# Patient Record
Sex: Male | Born: 1990 | Race: Black or African American | Hispanic: No | Marital: Married | Smoking: Never smoker
Health system: Southern US, Community
[De-identification: ages and names within clinical notes are randomized; demographics above are authoritative.]

---

## 2017-08-01 ENCOUNTER — Ambulatory Visit: Payer: Self-pay

## 2017-08-09 ENCOUNTER — Other Ambulatory Visit: Payer: Self-pay | Admitting: Internal Medicine

## 2017-08-09 ENCOUNTER — Ambulatory Visit
Admission: RE | Admit: 2017-08-09 | Discharge: 2017-08-09 | Disposition: A | Discharge status: Home / Self Care | Payer: Self-pay | Source: Ambulatory Visit | Attending: Internal Medicine | Admitting: Internal Medicine

## 2017-08-09 DIAGNOSIS — R7611 Nonspecific reaction to tuberculin skin test without active tuberculosis: Secondary | ICD-10-CM

## 2017-08-29 ENCOUNTER — Other Ambulatory Visit: Payer: Self-pay

## 2017-08-29 ENCOUNTER — Ambulatory Visit (INDEPENDENT_AMBULATORY_CARE_PROVIDER_SITE_OTHER): Payer: Medicaid Other | Admitting: Family Medicine

## 2017-08-29 VITALS — BP 100/60 | HR 78 | Temp 98.7°F | Ht 64.0 in | Wt 134.8 lb

## 2017-08-29 DIAGNOSIS — Z789 Other specified health status: Secondary | ICD-10-CM

## 2017-08-29 DIAGNOSIS — M25562 Pain in left knee: Secondary | ICD-10-CM | POA: Diagnosis not present

## 2017-08-29 DIAGNOSIS — M25561 Pain in right knee: Secondary | ICD-10-CM | POA: Diagnosis not present

## 2017-08-29 DIAGNOSIS — Z0289 Encounter for other administrative examinations: Secondary | ICD-10-CM

## 2017-08-29 DIAGNOSIS — R161 Splenomegaly, not elsewhere classified: Secondary | ICD-10-CM | POA: Diagnosis not present

## 2017-08-29 DIAGNOSIS — Z227 Latent tuberculosis: Secondary | ICD-10-CM

## 2017-08-29 DIAGNOSIS — R7611 Nonspecific reaction to tuberculin skin test without active tuberculosis: Secondary | ICD-10-CM | POA: Diagnosis not present

## 2017-08-29 MED ORDER — IBUPROFEN 200 MG PO TABS: 200.0000 mg | ORAL_TABLET | Freq: Four times a day (QID) | ORAL | 0 refills | Status: AC | PRN

## 2017-08-29 MED ORDER — ACETAMINOPHEN 325 MG PO TABS: 650.0000 mg | ORAL_TABLET | Freq: Four times a day (QID) | ORAL | 0 refills | Status: AC | PRN
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
## 2017-08-29 NOTE — Assessment & Plan Note (Signed)
Bilateral knee pain is likely due to overuse from standing for long periods of time at work.  There is no injury or trauma and knees on physical exam are normal.  Reassured patient and advised symptomatic treatment with ibuprofen or Tylenol at home.  If no improvement after 4 weeks may consider getting bilateral knee x-rays however this is unlikely to be an acute bony issue.

## 2017-08-29 NOTE — Patient Instructions (Addendum)
Try over the counter ibuprofen or tylenol for your knee pain. - ibuprofen is 1-2 tablets of 200mg  to take as needed every 6 hours - acetaminophen is 1-2 tablets of 325mg  to take as needed every 6 hours  We will set you up for a ultrasound of your spleen and have you come back for an appointment here in 4 weeks.

## 2017-08-29 NOTE — Assessment & Plan Note (Signed)
Splenomegaly found on physical exam today.  Patient is asymptomatic.  Will obtain ultrasound to determine size of spleen.  Follow-up in 4 weeks.

## 2017-08-29 NOTE — Assessment & Plan Note (Signed)
Patient has had screening at the health department and was found to have latent TB.  He voiced that he would be contacted for treatment of this by the health department.

## 2017-08-30 DIAGNOSIS — Z227 Latent tuberculosis: Secondary | ICD-10-CM | POA: Insufficient documentation

## 2017-08-30 NOTE — Assessment & Plan Note (Signed)
Followed by Health dept.

## 2017-09-06 ENCOUNTER — Ambulatory Visit (HOSPITAL_COMMUNITY): Payer: Medicaid Other

## 2017-09-18 ENCOUNTER — Ambulatory Visit (HOSPITAL_COMMUNITY): Payer: Medicaid Other

## 2017-09-18 ENCOUNTER — Ambulatory Visit (HOSPITAL_COMMUNITY)
Admission: RE | Admit: 2017-09-18 | Discharge: 2017-09-18 | Disposition: A | Discharge status: Home / Self Care | Payer: Medicaid Other | Source: Ambulatory Visit | Attending: Family Medicine | Admitting: Family Medicine

## 2017-09-18 ENCOUNTER — Other Ambulatory Visit: Payer: Self-pay | Admitting: Family Medicine

## 2017-09-18 DIAGNOSIS — R161 Splenomegaly, not elsewhere classified: Secondary | ICD-10-CM

## 2017-09-26 ENCOUNTER — Ambulatory Visit: Payer: Medicaid Other | Admitting: Family Medicine

## 2017-10-16 ENCOUNTER — Encounter (HOSPITAL_COMMUNITY): Payer: Self-pay

## 2017-10-16 ENCOUNTER — Ambulatory Visit (HOSPITAL_COMMUNITY)
Admission: EM | Admit: 2017-10-16 | Discharge: 2017-10-16 | Disposition: A | Discharge status: Home / Self Care | Payer: Medicaid Other | Attending: Family Medicine | Admitting: Family Medicine

## 2017-10-16 DIAGNOSIS — M940 Chondrocostal junction syndrome [Tietze]: Secondary | ICD-10-CM | POA: Diagnosis not present

## 2017-10-16 DIAGNOSIS — K219 Gastro-esophageal reflux disease without esophagitis: Secondary | ICD-10-CM

## 2017-10-16 MED ORDER — DICLOFENAC SODIUM 75 MG PO TBEC: 75.0000 mg | DELAYED_RELEASE_TABLET | Freq: Two times a day (BID) | ORAL | 0 refills | Status: AC

## 2017-10-16 MED ORDER — RANITIDINE HCL 150 MG PO CAPS: 150.0000 mg | ORAL_CAPSULE | Freq: Every evening | ORAL | 1 refills | Status: AC
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
## 2017-10-16 NOTE — ED Triage Notes (Signed)
Pt complains of pain in his left chest area, said the pain has been present a year. Hurts when he turns a certain way or when he lifts something up. States it's worse when it's cold. Did not try any otc medication for this. Said the pain was intermittent until this month and now it's constant.

## 2018-07-19 ENCOUNTER — Ambulatory Visit (HOSPITAL_COMMUNITY)
Admission: RE | Admit: 2018-07-19 | Discharge: 2018-07-19 | Disposition: A | Discharge status: Home / Self Care | Payer: Self-pay | Attending: Psychiatry | Admitting: Psychiatry

## 2018-07-19 DIAGNOSIS — F4320 Adjustment disorder, unspecified: Secondary | ICD-10-CM | POA: Insufficient documentation

## 2018-07-19 NOTE — BH Assessment (Signed)
Assessment Note  Bryan Chaney is a separated 27 y.o. male who presents voluntarily to [facility] for a walk-in assessment via EMS. Pt speaks Swahili as his primary language & interpreter machine was used for assessment. EMS relayed that pt's brother & mother have concerns pt had not been sleeping in past few days. Pt has no known history of MH dx or tx. Pt reports no psychiatric medication. He denies current and past suicidal ideation & no past attempts. Pt denies sx of depression but reports some difficulty with decision to leave wife in [place]. Pt denies  homicidal ideation/ history of violence. Pt denies AVH & other sx of psychosis. Pt states current stressors include working a lot of hours with machines at [employer].  [name], Bryan Chaney. He denies hx of abuse and trauma. Pt denies family history of mental health dx. Pt has fair insight and judgment. Pt's memory is intact. Legal history includes no charges. ? Pt has no hx of IP or OP tx. Pt reports occasional use of alcohol "about 1-2 drinks twice weekly" / No other drug use ? MSE: Pt is casually dressed, alert, oriented x4 with normal speech and normal motor behavior. Eye contact is good. Pt's mood is euthymic and pleasant. Affect is appropriate to circumstance and congruent with mood. Thought process is coherent, incoherent (unsure if due to language barrier) & relevant. There is no indication pt is currently responding to internal stimuli or experiencing delusional thought content. Pt was cooperative throughout assessment.    Disposition: [name], NP recommends pt return home & seek medical care for sore throat & ears if he so chooses. Pt advised he may return for TTS assessment or seek outpt behavioral health tx if sleep becomes a problem  Diagnosis: F43.20 Adjustment Disorder, unspecified  Past Medical History: No past medical history on file.  No past surgical history on file.  Family  History: No family history on file.  Social History:  reports that he has never smoked. He has never used smokeless tobacco. He reports that he drinks alcohol. He reports that he does not use drugs.  Additional Social History:  Alcohol / Drug Use Pain Medications: none reported Prescriptions: none reported Over the Counter: unknown History of alcohol / drug use?: Yes Substance #1 Name of Substance 1: alcohol 1 - Amount (size/oz): about 2 drinks 1 - Frequency: about twice weekly  CIWA:   COWS:    Allergies: No Known Allergies  Home Medications:  (Not in a hospital admission)  OB/GYN Status:  No LMP for male patient.  General Assessment Data Location of Assessment: [facility] TTS Assessment: In system Is this a Tele or Face-to-Face Assessment?: Face-to-Face Is this an Initial Assessment or a Re-assessment for this encounter?: Initial Assessment Patient Accompanied by:: N/A Language Other than English: Yes What is your preferred language: Other (Comment: Enter the language)(Swahili) Living Arrangements: Other (Comment) What gender do you identify as?: Male Marital status: Separated Living Arrangements: Other (Comment)(own home) Can pt return to current living arrangement?: Yes Admission Status: Voluntary Is patient capable of signing voluntary admission?: Yes Referral Source: Self/Family/Friend Insurance type: [employer]     Crisis Care Plan Living Arrangements: Other (Comment)(own home) Name of Psychiatrist: none Name of Therapist: none  Education Status Is patient currently in school?: No Is the patient employed, unemployed or receiving disability?: Employed([employer])  Risk to self with the past 6 months Suicidal Ideation: No Has patient been a risk to  self within the past 6 months prior to admission? : No Suicidal Intent: No Has patient had any suicidal intent within the past 6 months prior to admission? : No Is patient at risk for  suicide?: No Suicidal Plan?: No Has patient had any suicidal plan within the past 6 months prior to admission? : No Access to Means: No What has been your use of drugs/alcohol within the last 12 months?: denies Previous Attempts/Gestures: No How many times?: 0 Intentional Self Injurious Behavior: None Family Suicide History: No Recent stressful life event(s): Other (Comment)(working a lot) Persecutory voices/beliefs?: No Depression: No Depression Symptoms: Insomnia Substance abuse history and/or treatment for substance abuse?: No Suicide prevention information given to non-admitted patients: Yes  Risk to Others within the past 6 months Homicidal Ideation: No Does patient have any lifetime risk of violence toward others beyond the six months prior to admission? : No Thoughts of Harm to Others: No Current Homicidal Intent: No Current Homicidal Plan: No Access to Homicidal Means: No History of harm to others?: No Assessment of Violence: None Noted Does patient have access to weapons?: No Criminal Charges Pending?: No Does patient have a court date: No Is patient on probation?: No  Psychosis Hallucinations: None noted Delusions: None noted  Mental Status Report Appearance/Hygiene: Unremarkable Eye Contact: Good Motor Activity: Freedom of movement Speech: Language other than English, Logical/coherent, Incoherent Level of Consciousness: Alert Mood: Apprehensive, Pleasant, Euthymic Affect: Appropriate to circumstance Anxiety Level: Moderate Thought Processes: Coherent, Relevant Judgement: Unimpaired Orientation: Person, Place, Time, Situation Obsessive Compulsive Thoughts/Behaviors: None  Cognitive Functioning Concentration: Normal Memory: Recent Intact, Remote Intact Is patient IDD: No Insight: Fair Impulse Control: Good Appetite: Fair Have you had any weight changes? : Loss Amount of the weight change? (lbs): (small amount, unknown) Sleep: Decreased Total Hours of  Sleep: (just recently due to working more. Not usually a problem) Vegetative Symptoms: None  ADLScreening [facility] Assessment Services) Patient's cognitive ability adequate to safely complete daily activities?: Yes Patient able to express need for assistance with ADLs?: Yes Independently performs ADLs?: Yes (appropriate for developmental age)  Prior Inpatient Therapy Prior Inpatient Therapy: No  Prior Outpatient Therapy Prior Outpatient Therapy: No Does patient have an ACCT team?: No Does patient have Intensive In-House Services?  : No Does patient have [facility] services? : No Does patient have P4CC services?: No  ADL Screening (condition at time of admission) Patient's cognitive ability adequate to safely complete daily activities?: Yes Patient able to express need for assistance with ADLs?: Yes Independently performs ADLs?: Yes (appropriate for developmental age)  Home Assistive Devices/Equipment Home Assistive Devices/Equipment: None  Therapy Consults (therapy consults require a physician order) PT Evaluation Needed: No OT Evalulation Needed: No SLP Evaluation Needed: No Abuse/Neglect Assessment (Assessment to be complete while patient is alone) Abuse/Neglect Assessment Can Be Completed: Yes Physical Abuse: Denies Verbal Abuse: Denies Sexual Abuse: Denies Exploitation of patient/patient's resources: Denies Self-Neglect: Denies Values / Beliefs Cultural Requests During Hospitalization: None Spiritual Requests During Hospitalization: None Consults Spiritual Care Consult Needed: No Social Work Consult Needed: No Merchant navy officer (For Healthcare) Does Patient Have a Medical Advance Directive?: No Would patient like information on creating a medical advance directive?: No - Patient declined          Disposition: [name], NP recommends pt return home & seek medical care for sore throat & ears if he so chooses. Pt advised he may return for TTS assessment or seek  outpt behavioral health tx if sleep becomes a problem Disposition  Initial Assessment Completed for this Encounter: Yes Disposition of Patient: Discharge  On Site Evaluation by:   Reviewed with Physician:    Clearnce Sorreleirdre H Noheli Melder 07/19/2018 4:28 PM

## 2018-07-26 ENCOUNTER — Encounter (HOSPITAL_COMMUNITY): Payer: Self-pay | Admitting: Emergency Medicine

## 2018-07-26 ENCOUNTER — Emergency Department (HOSPITAL_COMMUNITY)
Admission: EM | Admit: 2018-07-26 | Discharge: 2018-07-26 | Disposition: A | Discharge status: Home / Self Care | Payer: Medicaid Other | Attending: Emergency Medicine | Admitting: Emergency Medicine

## 2018-07-26 ENCOUNTER — Other Ambulatory Visit: Payer: Self-pay

## 2018-07-26 ENCOUNTER — Encounter (HOSPITAL_COMMUNITY): Payer: Self-pay

## 2018-07-26 ENCOUNTER — Emergency Department (HOSPITAL_COMMUNITY)
Admission: EM | Admit: 2018-07-26 | Discharge: 2018-07-26 | Disposition: A | Discharge status: Home / Self Care | Payer: Self-pay | Attending: Emergency Medicine | Admitting: Emergency Medicine

## 2018-07-26 DIAGNOSIS — R002 Palpitations: Secondary | ICD-10-CM | POA: Insufficient documentation

## 2018-07-26 DIAGNOSIS — K219 Gastro-esophageal reflux disease without esophagitis: Secondary | ICD-10-CM | POA: Insufficient documentation

## 2018-07-26 DIAGNOSIS — R600 Localized edema: Secondary | ICD-10-CM | POA: Insufficient documentation

## 2018-07-26 DIAGNOSIS — G44219 Episodic tension-type headache, not intractable: Secondary | ICD-10-CM | POA: Insufficient documentation

## 2018-07-26 DIAGNOSIS — W4904XA Ring or other jewelry causing external constriction, initial encounter: Secondary | ICD-10-CM | POA: Insufficient documentation

## 2018-07-26 MED ORDER — SULFAMETHOXAZOLE-TRIMETHOPRIM 800-160 MG PO TABS: 1.0000 | ORAL_TABLET | Freq: Two times a day (BID) | ORAL | 0 refills | Status: AC

## 2018-07-26 MED ORDER — CEPHALEXIN 250 MG PO CAPS: 250.0000 mg | ORAL_CAPSULE | Freq: Four times a day (QID) | ORAL | 0 refills | Status: DC

## 2018-07-26 MED ORDER — CEPHALEXIN 250 MG PO CAPS
250.0000 mg | ORAL_CAPSULE | Freq: Once | ORAL | Status: AC
Start: 2018-07-26 — End: 2018-07-26
  Administered 2018-07-26: 250 mg via ORAL
  Filled 2018-07-26: qty 1

## 2018-07-26 MED ORDER — OXYCODONE-ACETAMINOPHEN 5-325 MG PO TABS
1.0000 | ORAL_TABLET | Freq: Once | ORAL | Status: AC
  Administered 2018-07-26: 1 via ORAL
  Filled 2018-07-26: qty 1

## 2018-07-26 MED ORDER — SULFAMETHOXAZOLE-TRIMETHOPRIM 800-160 MG PO TABS
1.0000 | ORAL_TABLET | Freq: Once | ORAL | Status: AC
  Administered 2018-07-26: 1 via ORAL
  Filled 2018-07-26: qty 1

## 2018-07-26 MED ORDER — FAMOTIDINE 20 MG PO TABS: 20.0000 mg | ORAL_TABLET | Freq: Every day | ORAL | 0 refills | Status: AC

## 2018-07-26 NOTE — ED Notes (Signed)
Bed: WA08 Expected date:  Expected time:  Means of arrival:  Comments: 
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
## 2018-07-26 NOTE — Discharge Instructions (Signed)
Take antibiotics as prescribed.  Take the entire course, even if your symptoms improve. Soak your hand in soapy water for 20 minutes at a time, 3 times a day. Use Tylenol and ibuprofen for pain. Use ice to help with pain and swelling. Take Pepcid daily to help with acid reflux and feeling like your heart is racing. Call your doctor tomorrow, whose information is listed below, to set up an appointment on Monday for follow-up and recheck of your finger and discussion about your palpitations. Return to the emergency room with any new, worsening, concerning symptoms.

## 2018-07-26 NOTE — ED Provider Notes (Signed)
[facility]   CSN: [id number] Arrival date & time: 07/26/18  1642     History   Chief Complaint Chief Complaint  Patient presents with  . Anxiety    HPI Bryan Chaney is a 27 y.o. male presenting for evaluation of palpitations and finger swelling.  Level 5 caveat due to language barrier.  Patient did not want interpreter.  Patient states that yesterday he had an episode of palpitations which lasted 30 to 60 minutes.  He describes it as feeling like his heart is racing.  This occurred when he went to lay down to sleep.  He denies recurrence of symptoms today.  I am unable to tell if this is happened previously.  Patient states that when this was happening, he felt like his "stomach was moving up and down."  He denies associated chest pain or shortness of breath.  He denies fevers, chills, cough, nausea, vomiting, abdominal pain, urinary symptoms, abnormal bowel movements.  Patiently, patient has a ring stuck on his right finger, which is been present for the past year.  He reports increased pain at this area, and increased swelling of the distal middle finger.  He denies numbness or tingling. Pt states he takes no medications daily. He has been unable to work due to his finger pain/swelling.   Additional history obtained from chart review, patient was seen last night for a headache.  Patient states his headache has completely resolved, and he did not talk about his symptoms today with the provider at that time. Additionally, patient treated for GERD earlier this year.  He states he is no longer taking this medicine.  HPI  History reviewed. No pertinent past medical history.  Patient Active Problem List   Diagnosis Date Noted  . 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 Language barrier 08/29/2017  . Refugee health examination 08/29/2017  . Acute pain of both knees 08/29/2017  . Splenomegaly 08/29/2017    History reviewed. No pertinent  surgical history.      Home Medications    Prior to Admission medications   Medication Sig Start Date End Date Taking? Authorizing Provider  acetaminophen (TYLENOL) 325 MG tablet Take 2 tablets (650 mg total) by mouth every 6 (six) hours as needed. 08/29/17   Leland Her, DO  cephALEXin (KEFLEX) 250 MG capsule Take 1 capsule (250 mg total) by mouth 4 (four) times daily. 07/26/18   Dola Lunsford, PA-C  diclofenac (VOLTAREN) 75 MG EC tablet Take 1 tablet (75 mg total) by mouth 2 (two) times daily. Take as needed for the pain in your chest wall. 10/16/17   Mardella Layman, MD  famotidine (PEPCID) 20 MG tablet Take 1 tablet (20 mg total) by mouth daily. 07/26/18   Semisi Biela, PA-C  ibuprofen (ADVIL) 200 MG tablet Take 1 tablet (200 mg total) by mouth every 6 (six) hours as needed. 08/29/17   Leland Her, DO  ranitidine (ZANTAC) 150 MG capsule Take 1 capsule (150 mg total) by mouth every evening. 10/16/17   Mardella Layman, MD  sulfamethoxazole-trimethoprim (BACTRIM DS,SEPTRA DS) 800-160 MG tablet Take 1 tablet by mouth 2 (two) times daily for 7 days. 07/26/18 08/02/18  Skarlette Lattner, PA-C    Family History No family history on file.  Social History Social History   Tobacco Use  . Smoking status: Never Smoker  . Smokeless tobacco: Never Used  Substance Use Topics  . Alcohol use: Yes    Comment: 2-3 drinks about 2-3 times  a week  . Drug use: No     Allergies   Patient has no known allergies.   Review of Systems Review of Systems  Cardiovascular: Positive for palpitations.  Musculoskeletal: Positive for joint swelling.  All other systems reviewed and are negative.    Physical Exam Updated Vital Signs BP 120/65 (BP Location: Right Arm)   Pulse 74   Temp 98.6 F (37 C) (Oral)   Resp 18   Ht 5\' 2"  (1.575 m)   Wt 59.8 kg   SpO2 99%   BMI 24.11 kg/m   Physical Exam  Constitutional: He is oriented to person, place, and time. He appears well-developed and  well-nourished. No distress.  Sitting in the bed in no acute distress  HENT:  Head: Normocephalic and atraumatic.  MM moist  Eyes: Pupils are equal, round, and reactive to light. Conjunctivae and EOM are normal.  Neck: Normal range of motion. Neck supple.  Cardiovascular: Normal rate, regular rhythm and intact distal pulses.  Regular rate and rhythm.  No tachycardia or palpitations noted.  Pulmonary/Chest: Effort normal and breath sounds normal. No respiratory distress. He has no wheezes.  Speaking in full sentences.  Clear lung sounds in all fields.  Abdominal: Soft. He exhibits no distension and no mass. There is no tenderness. There is no guarding.  No tenderness palpation the abdomen.  Soft without rigidity, guarding, distention.  Negative rebound.  Musculoskeletal: Normal range of motion. He exhibits edema.  Right middle finger swollen distally to the ring.  Under the ring, purulent material can be expressed with a foul odor.  Good cap refill of the distal finger.  Sensation intact of the finger.  Decreased range of motion due to the swelling.  No swelling or injury noted elsewhere in the hand.  Neurological: He is alert and oriented to person, place, and time. No sensory deficit.  Skin: Skin is warm and dry. Capillary refill takes less than 2 seconds.  Psychiatric: He has a normal mood and affect.  Nursing note and vitals reviewed.            ED Treatments / Results  Labs (all labs ordered are listed, but only abnormal results are displayed) Labs Reviewed - No data to display  EKG None  Radiology No results found.  Procedures Procedures (including critical care time)  RING REMOVAL Patient with a ring stuck on his right middle finger.  No neurologic deficit.  Discussed plan to cut the ring, patient agrees.  Discussed risks including cutting his finger or inability to remove ring, pt states he understands and agrees to plan.  Ring removed with manual heavy metal ring  cutter.  Patient tolerated well with no complications.  Medications Ordered in ED Medications  oxyCODONE-acetaminophen (PERCOCET/ROXICET) 5-325 MG per tablet 1 tablet (1 tablet Oral Given 07/26/18 1920)  sulfamethoxazole-trimethoprim (BACTRIM DS,SEPTRA DS) 800-160 MG per tablet 1 tablet (1 tablet Oral Given 07/26/18 2048)  cephALEXin (KEFLEX) capsule 250 mg (250 mg Oral Given 07/26/18 2048)     Initial Impression / Assessment and Plan / ED Course  I have reviewed the triage vital signs and the nursing notes.  Pertinent labs & imaging results that were available during my care of the patient were reviewed by me and considered in my medical decision making (see chart for details).     Patient presenting for evaluation of right middle finger swelling and palpitations.  Physical exam shows patient is afebrile not tachycardic.  Appears nontoxic.  Upon further history, palpitations  appear to be associated with his stomach moving up and down.  Additional history shows patient was once diagnosed with GERD, he states he is no longer on any medicine for this.  As symptoms worsened when he was laying flat, consider got GERD as cause.  EKG obtained, no STEMI or concerning findings.  Case discussed with attending, Dr. Jacqulyn BathLong agrees to plan. Doubt ACS.  Will treat with Pepcid and have him reassessed with his PCP. Patient's middle finger with significant swelling distal to the ring.  He needed to be cut off in order to be removed.  Imprint of the ring/where the ring adhered to the finger was noticed with a foul odor.  Pictures shown above.  Concern for infection.  Will treat with Keflex and Bactrim.  Patient to follow-up with his PCP for wound recheck on Monday.  Discussed soaking his hand in soapy water.  Tylenol and ibuprofen for pain.  At this time, patient appears safe for discharge.  Return precautions given.  Patient states he understands and agrees to plan.   Final Clinical Impressions(s) / ED Diagnoses    Final diagnoses:  Ring or other jewelry causing external constriction, initial encounter  Palpitation  Gastroesophageal reflux disease, esophagitis presence not specified    ED Discharge Orders         Ordered    sulfamethoxazole-trimethoprim (BACTRIM DS,SEPTRA DS) 800-160 MG tablet  2 times daily     07/26/18 2019    cephALEXin (KEFLEX) 250 MG capsule  4 times daily     07/26/18 2019    famotidine (PEPCID) 20 MG tablet  Daily     07/26/18 2019           Alveria ApleyCaccavale, Clotine Heiner, PA-C 07/26/18 2300    Maia PlanLong, Joshua G, MD 07/27/18 1549

## 2018-07-26 NOTE — ED Notes (Signed)
ED Provider at bedside. Utilizing interpretive services

## 2018-07-26 NOTE — ED Provider Notes (Signed)
[facility] Provider Note  CSN: [id number] Arrival date & time: 07/26/18 0108  Chief Complaint(s) Headache  HPI Bryan Chaney is a 27 y.o. male   The history is limited by a language barrier. A language interpreter was used.  Headache   This is a new problem. The current episode started 12 to 24 hours ago. The problem occurs constantly. The problem has been resolved. Associated symptoms include palpitations. Pertinent negatives include no fever and no orthopnea.  Palpitations   This is a new problem. The current episode started 1 to 2 hours ago. The problem occurs constantly. The problem has been resolved. On average, each episode lasts 30 minutes. Associated symptoms include headaches. Pertinent negatives include no fever, no numbness, no chest pain, no orthopnea, no leg pain, no lower extremity edema, no dizziness and no cough. He has tried nothing for the symptoms.   Patient states that he he is here more for the palpitations.  He denied any illicit drug use.  Denies any alcohol consumption.  Denies any recent fevers or infections.  Denies any recent travel.  Past Medical History History reviewed. No pertinent past medical history. Patient Active Problem List   Diagnosis Date Noted  . 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 Language barrier 08/29/2017  . Refugee health examination 08/29/2017  . Acute pain of both knees 08/29/2017  . Splenomegaly 08/29/2017   Home Medication(s) Prior to Admission medications   Medication Sig Start Date End Date Taking? Authorizing Provider  acetaminophen (TYLENOL) 325 MG tablet Take 2 tablets (650 mg total) by mouth every 6 (six) hours as needed. 08/29/17   Leland Her, DO  diclofenac (VOLTAREN) 75 MG EC tablet Take 1 tablet (75 mg total) by mouth 2 (two) times daily. Take as needed for the pain in your chest wall. 10/16/17   Mardella Layman, MD  ibuprofen (ADVIL) 200 MG tablet Take 1 tablet (200 mg total) by mouth every 6  (six) hours as needed. 08/29/17   Leland Her, DO  ranitidine (ZANTAC) 150 MG capsule Take 1 capsule (150 mg total) by mouth every evening. 10/16/17   Mardella Layman, MD                                                                                                                                    Past Surgical History History reviewed. No pertinent surgical history. Family History History reviewed. No pertinent family history.  Social History Social History   Tobacco Use  . Smoking status: Never Smoker  . Smokeless tobacco: Never Used  Substance Use Topics  . Alcohol use: Yes    Comment: 2-3 drinks about 2-3 times a week  . Drug use: No   Allergies Patient has no known allergies.  Review of Systems Review of Systems  Constitutional: Negative for fever.  Respiratory: Negative for cough.   Cardiovascular: Positive for palpitations. Negative for chest pain and orthopnea.  Neurological: Positive for headaches. Negative for dizziness and numbness.   All other systems are reviewed and are negative for acute change except as noted in the HPI  Physical Exam Vital Signs  I have reviewed the triage vital signs BP (!) 137/91   Pulse 70   Temp 98.1 F (36.7 C) (Oral)   Resp 18   Ht 5\' 2"  (1.575 m)   Wt 59.9 kg   SpO2 100%   BMI 24.14 kg/m   Physical Exam  Constitutional: He is oriented to person, place, and time. He appears well-developed and well-nourished. No distress.  HENT:  Head: Normocephalic and atraumatic.  Nose: Nose normal.  Eyes: Pupils are equal, round, and reactive to light. Conjunctivae and EOM are normal. Right eye exhibits no discharge. Left eye exhibits no discharge. No scleral icterus.  Neck: Normal range of motion. Neck supple.  Cardiovascular: Normal rate and regular rhythm. Exam reveals no gallop and no friction rub.  No murmur heard. Pulmonary/Chest: Effort normal and breath sounds normal. No stridor. No respiratory distress. He has no rales.    Abdominal: Soft. He exhibits no distension. There is no tenderness.  Musculoskeletal: He exhibits no edema or tenderness.  Neurological: He is alert and oriented to person, place, and time. He has normal strength. No cranial nerve deficit or sensory deficit. Coordination and gait normal.  Skin: Skin is warm and dry. No rash noted. He is not diaphoretic. No erythema.  Psychiatric: He has a normal mood and affect.  Vitals reviewed.   ED Results and Treatments Labs (all labs ordered are listed, but only abnormal results are displayed) Labs Reviewed - No data to display                                                                                                                       EKG  EKG Interpretation  Date/Time:  Thursday July 26 2018 03:56:53 EST Ventricular Rate:  71 PR Interval:    QRS Duration: 111 QT Interval:  392 QTC Calculation: 426 R Axis:   48 Text Interpretation:  Sinus rhythm ST elev, probable normal early repol pattern No significant change since last tracing Confirmed by Drema Pry [id number]) on 07/26/2018 4:00:32 AM      Radiology No results found. Pertinent labs & imaging results that were available during my care of the patient were reviewed by me and considered in my medical decision making (see chart for details).  Medications Ordered in ED Medications - No data to display  Procedures Procedures  (including critical care time)  Medical Decision Making / ED Course I have reviewed the nursing notes for this encounter and the patient's prior records (if available in EHR or on provided paperwork).    Patient reported headache which is now resolved.  Non focal neuro exam. No fever. Doubt meningitis. Doubt intracranial bleed. Doubt IIH. No indication for imaging.   Also complaining of palpitations which are also resolved.   EKG without acute ischemic changes, dysrhythmias, blocks, evidence of Brugada, or HOCM.  Low suspicion for pulmonary embolism.   The patient appears reasonably screened and/or stabilized for discharge and I doubt any other medical condition or other [facility] requiring further screening, evaluation, or treatment in the ED at this time prior to discharge.  The patient is safe for discharge with strict return precautions.   Final Clinical Impression(s) / ED Diagnoses Final diagnoses:  Episodic tension-type headache, not intractable  Palpitation    Disposition: Discharge  Condition: Good  I have discussed the results, Dx and Tx plan with the patient who expressed understanding and agree(s) with the plan. Discharge instructions discussed at great length. The patient was given strict return precautions who verbalized understanding of the instructions. No further questions at time of discharge.    ED Discharge Orders    None       Follow Up: Primary care provider   If you do not have a primary care physician, contact [facility] at [phone] for referral     This chart was dictated using voice recognition software.  Despite best efforts to proofread,  errors can occur which can change the documentation meaning.   Nira Connardama, Legrande Hao Eduardo, MD 07/26/18 [phone]

## 2018-07-26 NOTE — ED Triage Notes (Addendum)
Pt BIBA from home. Pt states that he is anxious and that he is cold. Pt moved here recently from the [place]. Pt seen earlier for same.

## 2018-07-26 NOTE — ED Notes (Signed)
Pt had a ring stuck on his right ring finger. Noticeable swelling around the ring and unable to remove by pulling. RN and PA used ring cutters to remove 3 separate portions of the ring. A foul odor came from area. Small amounts of blood noted to area, post-ring removal and pt reports 10/10 pain and is tearful.

## 2018-08-05 ENCOUNTER — Other Ambulatory Visit: Payer: Self-pay

## 2018-08-05 ENCOUNTER — Emergency Department (HOSPITAL_COMMUNITY): Payer: Self-pay

## 2018-08-05 ENCOUNTER — Emergency Department (HOSPITAL_COMMUNITY)
Admission: EM | Admit: 2018-08-05 | Discharge: 2018-08-05 | Disposition: A | Discharge status: Home / Self Care | Payer: Self-pay | Attending: Emergency Medicine | Admitting: Emergency Medicine

## 2018-08-05 ENCOUNTER — Encounter (HOSPITAL_COMMUNITY): Payer: Self-pay | Admitting: Behavioral Health

## 2018-08-05 DIAGNOSIS — Z79899 Other long term (current) drug therapy: Secondary | ICD-10-CM | POA: Insufficient documentation

## 2018-08-05 DIAGNOSIS — R51 Headache: Secondary | ICD-10-CM | POA: Insufficient documentation

## 2018-08-05 DIAGNOSIS — F329 Major depressive disorder, single episode, unspecified: Secondary | ICD-10-CM | POA: Insufficient documentation

## 2018-08-05 DIAGNOSIS — Z046 Encounter for general psychiatric examination, requested by authority: Secondary | ICD-10-CM | POA: Insufficient documentation

## 2018-08-05 DIAGNOSIS — G479 Sleep disorder, unspecified: Secondary | ICD-10-CM | POA: Insufficient documentation

## 2018-08-05 DIAGNOSIS — R443 Hallucinations, unspecified: Secondary | ICD-10-CM

## 2018-08-05 DIAGNOSIS — R440 Auditory hallucinations: Secondary | ICD-10-CM | POA: Insufficient documentation

## 2018-08-05 LAB — ACETAMINOPHEN LEVEL

## 2018-08-05 LAB — CBC WITH DIFFERENTIAL/PLATELET
Abs Immature Granulocytes: 0.01 10*3/uL (ref 0.00–0.07)
BASOS ABS: 0 10*3/uL (ref 0.0–0.1)
Basophils Relative: 1 %
EOS ABS: 0.2 10*3/uL (ref 0.0–0.5)
Eosinophils Relative: 4 %
HEMATOCRIT: 50.5 % (ref 39.0–52.0)
Hemoglobin: 16 g/dL (ref 13.0–17.0)
Immature Granulocytes: 0 %
LYMPHS ABS: 1.6 10*3/uL (ref 0.7–4.0)
Lymphocytes Relative: 42 %
MCH: 25.3 pg — ABNORMAL LOW (ref 26.0–34.0)
MCHC: 31.7 g/dL (ref 30.0–36.0)
MCV: 79.8 fL — AB (ref 80.0–100.0)
Monocytes Absolute: 0.4 10*3/uL (ref 0.1–1.0)
Monocytes Relative: 9 %
NRBC: 0 % (ref 0.0–0.2)
Neutro Abs: 1.7 10*3/uL (ref 1.7–7.7)
Neutrophils Relative %: 44 %
Platelets: 198 10*3/uL (ref 150–400)
RBC: 6.33 MIL/uL — ABNORMAL HIGH (ref 4.22–5.81)
RDW: 14.1 % (ref 11.5–15.5)
WBC: 3.9 10*3/uL — ABNORMAL LOW (ref 4.0–10.5)

## 2018-08-05 LAB — COMPREHENSIVE METABOLIC PANEL
ALBUMIN: 4.1 g/dL (ref 3.5–5.0)
ALK PHOS: 64 U/L (ref 38–126)
ALT: 13 U/L (ref 0–44)
ANION GAP: 12 (ref 5–15)
AST: 18 U/L (ref 15–41)
BILIRUBIN TOTAL: 1.5 mg/dL — AB (ref 0.3–1.2)
BUN: 7 mg/dL (ref 6–20)
CALCIUM: 9.4 mg/dL (ref 8.9–10.3)
CO2: 25 mmol/L (ref 22–32)
Chloride: 102 mmol/L (ref 98–111)
Creatinine, Ser: 1.02 mg/dL (ref 0.61–1.24)
Glucose, Bld: 99 mg/dL (ref 70–99)
POTASSIUM: 4.3 mmol/L (ref 3.5–5.1)
Sodium: 139 mmol/L (ref 135–145)
TOTAL PROTEIN: 7.5 g/dL (ref 6.5–8.1)

## 2018-08-05 LAB — ETHANOL

## 2018-08-05 LAB — SALICYLATE LEVEL: Salicylate Lvl: <7 mg/dL (ref 2.8–30.0)

## 2018-08-05 MED ORDER — MELATONIN 5 MG PO TABS: 5.0000 mg | ORAL_TABLET | Freq: Every evening | ORAL | 0 refills | Status: AC | PRN

## 2018-08-05 NOTE — ED Notes (Signed)
Friend arrived to transport pt from ED.

## 2018-08-05 NOTE — ED Notes (Signed)
Patient transported to CT 

## 2018-08-05 NOTE — ED Notes (Signed)
Pt ambulatory to Rm 52 - wearing burgundy scrubs. Pt's belongings - 1 labeled belongings bag - placed at nurses' desk.

## 2018-08-05 NOTE — ED Notes (Signed)
[facility] and phone interpretor.

## 2018-08-05 NOTE — BH Assessment (Signed)
Tele Assessment Note   Patient Name: Bryan Chaney MRN: [id number] Referring Physician: EDP Location of Patient: [facility] Location of Provider: Behavioral Health TTS Department  Bryan Chaney is a 27 y.o. male who presented to [facility] on voluntary basis with complaint of insomnia and stress.  Pt was last assessed by TTS in November -- at that time, he presented to [facility] as a walk-in with similar complaint.  At that time, Pt was discharged.  Pt is from [facility] [place], and he is currently from his wife and child (who reside in a refugee camp).  Pt's primary language is Swahili, and Chartered loss adjuster used an Equities trader.  However, it is now noted that Pt can converse in [place].  Pt reported that he came to the hospital because he is struggling with sleep due to stress.  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  Pt stated that he has roommates and sometimes wonders if they cast black magic on him (a cultural statement and not indicative of psychosis).  Pt endorsed sadness, anxiety, and insomnia.  Pt denied suicidal ideation, homicidal ideation, visual hallucination, and substance use concerns.  Pt reported that occasionally he will experience visual hallucinations, but these are infrequent and not frightening to him.  Pt denied a history of abuse. Pt denied any past psychiatric care.  When asked why he came to the hospital, he stated it was because he needed something for sleep.  During assessment, Pt presented as alert and oriented.  He had good eye contact and was cooperative.  Pt was dressed in scrubs, and he appeared appropriately groomed.  Pt was apprehensive and preoccupied.  Affect was congruent with mood.  Pt endorsed anxiety, sadness, insomnia.  Pt's speech was normal in rate, rhythm, and volume.  Thought processes were within normal range, and thought content was logical and goal-oriented.  There was no evidence of delusion.  Memory and concentration were intact.  Insight, judgment, and impulse  control were fair.  Consulted with T. Money, NP, who also spoke with Pt.  Pt is now psych-cleared.  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  No past surgical history on file.  Family History: No family history on file.  Social History:  reports that he has never smoked. He has never used smokeless tobacco. He reports current alcohol use. He reports that he does not use drugs.  Additional Social History:  Alcohol / Drug Use Pain Medications: See MAR Prescriptions: See MAR Over the Counter: See MAR History of alcohol / drug use?: Yes Substance #1 Name of Substance 1: Acohol 1 - Amount (size/oz): 1-2 drinks 2x a week 1 - Frequency: Occasional 1 - Duration: Ongoing  CIWA: CIWA-Ar BP: 125/81 Pulse Rate: 64 COWS:    Allergies: No Known Allergies  Home Medications: (Not in a hospital admission)   OB/GYN Status:  No LMP for male patient.  General Assessment Data Location of Assessment: [facility] ED TTS Assessment: In system Is this a Tele or Face-to-Face Assessment?: Tele Assessment Is this an Initial Assessment or a Re-assessment for this encounter?: Initial Assessment Patient Accompanied by:: N/A Language Other than English: Yes What is your preferred language: Other (Comment: Enter the language)(Swahili; however, Pt speaks [place]) Living Arrangements: Other (Comment) What gender do you identify as?: Male Marital status: Separated Living Arrangements: Other (Comment)(Home and roommates) Can pt return to current living arrangement?: Yes Admission Status: Voluntary Is patient capable of signing voluntary admission?:  Yes Referral Source: Self/Family/Friend Insurance type: None     Crisis Care Plan Living Arrangements: Other (Comment)(Home and roommates) Name of Psychiatrist: none Name of Therapist: none  Education Status Is patient currently in school?: No Is the patient employed, unemployed  or receiving disability?: Employed([employer])  Risk to self with the past 6 months Suicidal Ideation: No Has patient been a risk to self within the past 6 months prior to admission? : No Suicidal Intent: No Has patient had any suicidal intent within the past 6 months prior to admission? : No Is patient at risk for suicide?: No Suicidal Plan?: No Has patient had any suicidal plan within the past 6 months prior to admission? : No Access to Means: No What has been your use of drugs/alcohol within the last 12 months?: Alcohol Previous Attempts/Gestures: No How many times?: (0) Intentional Self Injurious Behavior: None Family Suicide History: No Recent stressful life event(s): Other (Comment), Trauma (Comment)(Sleep; wife is in refugee camp) Persecutory voices/beliefs?: No Depression: Yes Depression Symptoms: Insomnia Substance abuse history and/or treatment for substance abuse?: No Suicide prevention information given to non-admitted patients: Not applicable  Risk to Others within the past 6 months Homicidal Ideation: No Does patient have any lifetime risk of violence toward others beyond the six months prior to admission? : No Thoughts of Harm to Others: No Current Homicidal Intent: No Current Homicidal Plan: No Access to Homicidal Means: No History of harm to others?: No Assessment of Violence: None Noted Does patient have access to weapons?: No Criminal Charges Pending?: No Does patient have a court date: No Is patient on probation?: No  Psychosis Hallucinations: Visual(Occasionally, non-frightening visual) Delusions: None noted  Mental Status Report Appearance/Hygiene: Unremarkable Eye Contact: Good Motor Activity: Freedom of movement, Unremarkable Speech: Language other than English, Other (Comment) Level of Consciousness: Alert Mood: Apprehensive, Preoccupied Affect: Appropriate to circumstance Anxiety Level: Moderate Thought Processes: Relevant,  Coherent Judgement: Partial Orientation: Person, Place, Time, Situation Obsessive Compulsive Thoughts/Behaviors: None  Cognitive Functioning Concentration: Normal Memory: Recent Intact, Remote Intact Is patient IDD: No Insight: Fair Impulse Control: Good Appetite: Fair Have you had any weight changes? : No Change Sleep: Decreased Total Hours of Sleep: (Varied) Vegetative Symptoms: None  ADLScreening [facility] Assessment Services) Patient's cognitive ability adequate to safely complete daily activities?: Yes Patient able to express need for assistance with ADLs?: Yes Independently performs ADLs?: Yes (appropriate for developmental age)  Prior Inpatient Therapy Prior Inpatient Therapy: No  Prior Outpatient Therapy Prior Outpatient Therapy: No Does patient have an ACCT team?: No Does patient have Intensive In-House Services?  : No Does patient have [facility] services? : No Does patient have P4CC services?: No  ADL Screening (condition at time of admission) Patient's cognitive ability adequate to safely complete daily activities?: Yes Is the patient deaf or have difficulty hearing?: No Does the patient have difficulty seeing, even when wearing glasses/contacts?: No Does the patient have difficulty concentrating, remembering, or making decisions?: No Patient able to express need for assistance with ADLs?: Yes Does the patient have difficulty dressing or bathing?: No Independently performs ADLs?: Yes (appropriate for developmental age) Does the patient have difficulty walking or climbing stairs?: No Weakness of Legs: None Weakness of Arms/Hands: None  Home Assistive Devices/Equipment Home Assistive Devices/Equipment: None  Therapy Consults (therapy consults require a physician order) PT Evaluation Needed: No OT Evalulation Needed: No SLP Evaluation Needed: No Abuse/Neglect Assessment (Assessment to be complete while patient is alone) Abuse/Neglect Assessment Can Be Completed:  Yes Physical Abuse: Denies Verbal  Abuse: Denies Sexual Abuse: Denies Exploitation of patient/patient's resources: Denies Self-Neglect: Denies Values / Beliefs Cultural Requests During Hospitalization: None Spiritual Requests During Hospitalization: None Consults Spiritual Care Consult Needed: No Social Work Consult Needed: No Merchant navy officerAdvance Directives (For Healthcare) Does Patient Have a Medical Advance Directive?: No Would patient like information on creating a medical advance directive?: No - Patient declined          Disposition:  Disposition Initial Assessment Completed for this Encounter: Yes Disposition of Patient: Discharge(Per T. Money, Pt does not meet inpt criteria)  This service was provided via telemedicine using a 2-way, interactive audio and video technology.  Names of all persons participating in this telemedicine service and their role in this encounter. Name: Terrence Dupontanya, Bryan Chaney Role: Pt             Earline Mayotteugene T Adison Reifsteck 08/05/2018 4:07 PM

## 2018-08-05 NOTE — Progress Notes (Signed)
Patient is seen by me via tele-psych and I have consulted with Dr. Lucianne MussKumar.  It was reported to me that patient only speaks for [facility], but patient interrupted the interpreter and asked in English if we could speak normally.  Patient performed the rest of the interview in [place].  Patient denies any suicidal or homicidal ideations.  He does report having some visual hallucinations from time to time but they are not scary to him and they are not commanding.  He also reports that he has been under a lot of stress and has not been sleeping well and his intention of coming to the hospital was to get assistance with his sleep.  He reports his stress comes from his wife and children being in a refugee camp and he is worried about their safety there, which is understandable.  At this time patient does not meet inpatient criteria and is psychiatrically cleared.  I have contacted Dr. Jacqulyn BathLong and notified him of the recommendations and requested him to assist patient with some possible medication to help with his sleep at night.

## 2018-08-05 NOTE — ED Provider Notes (Signed)
Blood pressure 125/81, pulse 64, temperature 98.8 F (37.1 C), temperature source Oral, resp. rate 14, SpO2 97 %.  In short, Bryan Chaney is a 27 y.o. male with a chief complaint of Psychiatric Evaluation and Hallucinations (Auditory) .  Refer to the original H&P for additional details.  04:05 PM Spoke with TTS. Patient cleared by TTS. Will provide instructions to use Benadryl for sleep assistance and refer to PCP. Will provide outpatient psych resource list.   [name], MD     Maia PlanLong, Adrienna Karis G, MD 08/05/18 [phone]

## 2018-08-05 NOTE — ED Provider Notes (Signed)
[facility] Provider Note   CSN: [id number] Arrival date & time: 08/05/18  1122     History   Chief Complaint Chief Complaint  Patient presents with  . Psychiatric Evaluation  . Hallucinations    Auditory    HPI Bryan Chaney is a 27 y.o. male.  HPI  27 year old male, he has a history of tuberculosis, he is from [place], he has been here for at least a year and a half, his family is not here with him.  He has had a couple of visits to the emergency department for headache and anxiety, he reports that he does not take any medications.  He called today for 911 transport because it was thought that he might be hearing voices.  This is what the police state however the patient states that he feels like he is being poisoned.  He feels like he is being poisoned because he is not hungry, not eating, he feels like he is losing weight, he does endorse hearing voices but states he does not know what the voices are saying, he is not having visual hallucinations, he does not feel like he is a danger to himself or others.  The patient lives with others, he is away from his family who still lives in [place], they have been unable to follow him here because of travel restrictions.  The patient denies taking any other medications, he does have a mild headache, that was 1 of the things he was seen for in the past.  Review of the medical record shows that he has had no imaging of his brain, there has been no lab work  No past medical history on file.  Patient Active Problem List   Diagnosis Date Noted  . 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 Language barrier 08/29/2017  . Refugee health examination 08/29/2017  . Acute pain of both knees 08/29/2017  . Splenomegaly 08/29/2017    No past surgical history on file.      Home Medications    Prior to Admission medications   Medication Sig Start Date End Date Taking? Authorizing Provider  acetaminophen (TYLENOL) 325 MG  tablet Take 2 tablets (650 mg total) by mouth every 6 (six) hours as needed. 08/29/17   Leland HerYoo, Elsia J, DO  cephALEXin (KEFLEX) 250 MG capsule Take 1 capsule (250 mg total) by mouth 4 (four) times daily. 07/26/18   Caccavale, Sophia, PA-C  diclofenac (VOLTAREN) 75 MG EC tablet Take 1 tablet (75 mg total) by mouth 2 (two) times daily. Take as needed for the pain in your chest wall. 10/16/17   Mardella LaymanHagler, Marvelous Bouwens, MD  famotidine (PEPCID) 20 MG tablet Take 1 tablet (20 mg total) by mouth daily. 07/26/18   Caccavale, Sophia, PA-C  ibuprofen (ADVIL) 200 MG tablet Take 1 tablet (200 mg total) by mouth every 6 (six) hours as needed. 08/29/17   Leland HerYoo, Elsia J, DO  ranitidine (ZANTAC) 150 MG capsule Take 1 capsule (150 mg total) by mouth every evening. 10/16/17   Mardella LaymanHagler, Shine Mikes, MD    Family History No family history on file.  Social History Social History   Tobacco Use  . Smoking status: Never Smoker  . Smokeless tobacco: Never Used  Substance Use Topics  . Alcohol use: Yes    Comment: 2-3 drinks about 2-3 times a week  . Drug use: No     Allergies   Patient has no known allergies.   Review of Systems Review of Systems  All other systems reviewed and are negative.    Physical Exam Updated Vital Signs There were no vitals taken for this visit.  Physical Exam Vitals signs and nursing note reviewed.  Constitutional:      General: He is not in acute distress.    Appearance: He is well-developed.  HENT:     Head: Normocephalic and atraumatic.     Mouth/Throat:     Pharynx: No oropharyngeal exudate.  Eyes:     General: No scleral icterus.       Right eye: No discharge.        Left eye: No discharge.     Conjunctiva/sclera: Conjunctivae normal.     Pupils: Pupils are equal, round, and reactive to light.  Neck:     Musculoskeletal: Normal range of motion and neck supple.     Thyroid: No thyromegaly.     Vascular: No JVD.  Cardiovascular:     Rate and Rhythm: Normal rate and regular rhythm.      Heart sounds: Normal heart sounds. No murmur. No friction rub. No gallop.   Pulmonary:     Effort: Pulmonary effort is normal. No respiratory distress.     Breath sounds: Normal breath sounds. No wheezing or rales.  Abdominal:     General: Bowel sounds are normal. There is no distension.     Palpations: Abdomen is soft. There is no mass.     Tenderness: There is no abdominal tenderness.  Musculoskeletal: Normal range of motion.        General: No tenderness.  Lymphadenopathy:     Cervical: No cervical adenopathy.  Skin:    General: Skin is warm and dry.     Findings: No erythema or rash.  Neurological:     Mental Status: He is alert.     Coordination: Coordination normal.  Psychiatric:        Behavior: Behavior normal.     Comments: The patient makes occasional eye contact, he seems very calm, he does not have pressured speech or abnormal movements, he denies suicidality or homicidality, he does endorse auditory hallucinations      ED Treatments / Results  Labs (all labs ordered are listed, but only abnormal results are displayed) Labs Reviewed - No data to display  EKG None  Radiology No results found.  Procedures Procedures (including critical care time)  Medications Ordered in ED Medications - No data to display   Initial Impression / Assessment and Plan / ED Course  I have reviewed the triage vital signs and the nursing notes.  Pertinent labs & imaging results that were available during my care of the patient were reviewed by me and considered in my medical decision making (see chart for details).    It is unclear exactly what is going on with the patient, because of the lack of clarity will need to use the translator phone, will obtain some lab work and a CT scan of the brain, the patient has never had issues like this until he moved here according to his report, because of his history of tuberculosis and foreign travel he will need to have imaging and labs.  We  will consult with psychiatry if medically cleared.  Final Clinical Impressions(s) / ED Diagnoses   Final diagnoses:  None    ED Discharge Orders    None       Eber Hong, MD 08/06/18 Serena Croissant

## 2018-08-05 NOTE — ED Notes (Signed)
Pt was able to reach a friend to come pick him up - states "will be a while".

## 2018-08-05 NOTE — ED Triage Notes (Signed)
Pt brought in by [employer] for auditory hallucinations. Per [employer] pt called himself- hx of calling for same. Per [employer] pt speaks poor english, speaks Swahili. Pt calm and cooperative on arrival.

## 2018-08-05 NOTE — Discharge Instructions (Signed)
Substance Abuse Treatment Programs ° °Intensive Outpatient Programs °[facility]     °[address]      °[place], [place]                   °[phone]      ° °[facility] °[address] °[place], [place] °[phone] ° °[facility]     °(Inpatient and outpatient)     °[address]           °[phone]   ° °[facility] °[phone] (Suboxone and Methadone) ° °[address]      °[place], [place] [address]      °336-882-2125      ° °[address] °[place], [place] °852-3033 ° °[facility] (Outpatient/Inpatient, Chemical)    °(insurance only) [phone]      °       °[facility] (Groups & Residential) °[place], [place] °[phone] ° °   °[facility]     °[address]     °[place], [place]      °[phone]      ° °[facility] (for caregivers and family) °[address] °[place], [place] °336-299-4655 ° ° ° ° ° °Residential Treatment Programs °[facility]      °[address], [place], [place] [address]  °([phone]      ° °[facility] °[address] [place], [place] [address] °[phone] ° °[facility]        °336-248-8914      ° °[facility] °1-800-659-3381 ° °[facility]             °[address]                                         °[place], [place]                                                °877-615-2722 or 336-784-9470                              ° °[facility] [place] °[address] °[place], [address] °1.877.941.8954 ° °[facility]    °[address]      °[place], [place]     °[phone]      ° °[facility] °[address] °[place], [place] °336-285-9073 ° °[facility]   °[address]     °[place], [place] [address]     °[phone]      °Admissions: 8am-3pm M-F ° °[facility] °[address] °[place],  [place] °336-227-7417 ° °[facility]: Residential Program (90 Days)   °[place], [place]      °336-725-8389 or [phone]    ° °[facility] °[place], [place] °(Walk in Hours over the weekend or by referral) ° °[facility] °[address], [place],  [address] °([phone] ° °Crisis Mobile: [facility]:  1-877-626-1772 (for crisis response 24 hours a day) °[facility] Hotline:      1-800-256-2452 °Outpatient Psychiatry and Counseling ° °[facility]: Mobile Crisis   Management 24 hours:  1-877-626-1772 ° °[facility] sliding scale fee and walk in schedule: M-F 8am-12pm/1pm-3pm °[address]  °[place], [place] [address] °[phone] ° °[facility] °[address] °[place], [place] [address] °336-703-9650 ° °[facility] ([facility])- new patient walk-in appointments available Monday - Friday 8am -3pm.          °[address] °[place], [place] [address] °336-676-6840 or crisis line- 336-676-6905 ° °[facility] Intensive Outpatient Therapy Program °[address] °[place], [place] [address] °336-832-9804 ° °[facility]                  °Crisis Services      °[phone]      °[address]     °[place], [place] [address]                ° °[facility]   °[facility] °[phone] °[address] °[place], [place] [address] ° ° °[facility]          °[address]  °[place], [place] [address]       °([phone] ° °[facility] °[address] °[place], [place] [address] °336-292-1510 ° °[facility]    °[address]    °[place], [place] [address]     °[phone]      ° °Parish McKinney, MD     °[address]     °[place] [address]     °336-282-1251     °  °[facility] °[address] °[place] [address] ° °[facility]     °[address]     °[place], [place]      °336-542-2076      ° °[facility] °Shamsher Ahluwalia, MD °[address] °[place], [place] [address] °[phone] ° °[facility]     °[address]     °[place], [place] [address]     °336-274-1237      ° °[facility] °[address] °[place], [place] [address] °336-854-4450 °Resources for Temporary Residential Assistance/Crisis Centers ° °DAY CENTERS °[facility] °M-F 8am-3pm   °[address] [place], [place] [address]   336-332-0824 °Services include: laundry, barbering, support groups, case management, phone  & computer access, showers, AA/NA mtgs, mental health/substance abuse nurse, job skills class, disability information, VA assistance, spiritual classes, etc.  ° °HOMELESS SHELTERS ° °[facility]     °[facility]   °[address], [place]     °[phone]       °       °[facility] (women and children)       °[address] °[place], [place] [address] °336-275-0820 °[email or web address] for application and process °Application Required ° °[facility]   °[address]    °[place] [address]     °[phone]       °             °[facility] °[address] °[place], [place] [address] °336.273.5572 °336-235-0363(schedule application appt.) °Application Required ° °[facility] (women only)    °[address]     °[place],  [address]     °336-884-1039      °  Intake starts 6pm daily °Need valid ID, SSC, & Police report °[facility] [place] °[address] °[place], [place] °336-881-5420 °Application Required ° °[facility] (men only)     °[address]      °[place], [place]     °[phone]      ° °[facility] °(Pregnant women only) °[address] °[place], [place] °336-275-0206 ° °[facility]      °[address]      °[place], [place] [address]     °[phone]      °       °[facility] °[address] °[place], [place] °336-723-1848 °90 day commitment/SA/Application process ° °[facility](men only)     °[address]     °[place], [place]     °[phone]       °Check-in at 7pm     °       °[facility] °[address] °[place], [place] [address] °336-248-6684 °Men/Women/Women and Children must be there by 7 pm ° °[facility] °[place], [place] °336-722-8721                ° °

## 2018-08-05 NOTE — ED Notes (Signed)
ALL belongings - 1 labeled belongings bag - returned to pt - Pt verified all items present. Rx x 1 given w/discharge paperwork. Pt is able to understand and speak English - voiced understanding on how to take rx.

## 2018-08-05 NOTE — ED Notes (Signed)
Telepsych being performed. 

## 2018-08-05 NOTE — ED Notes (Signed)
Pt calling family member for ride from ED.

## 2018-08-08 ENCOUNTER — Ambulatory Visit: Payer: Self-pay | Admitting: Family Medicine

## 2018-08-08 NOTE — Progress Notes (Deleted)
Subjective:    Bryan Chaney is a 27 y.o. male who [facility] follow-up and concern for depression:  He has had multiple recent visits to the ED for palpitations, hallucinations, and ring getting stuck on his finger.  Ring had to be cut off from finger and patients started on abx.  Asked to FU with PCP afterwards.  Most recent ED visit was hallucinations.  Cleared by psych.    1.  Depression:  [place]  Family not here with him.  Currently, he is describing:  ***     Prev health:  Currently overdue for ***.  ROS as above per HPI.  Pertinently, no chest pain, palpitations, SOB, Fever, Chills, Abd pain, N/V/D.   The following portions of the patient's history were reviewed and updated as appropriate: allergies, current medications, past medical history, family and social history, and problem list. Patient is a nonsmoker.    PMH reviewed.  No past medical history on file. No past surgical history on file.  Medications reviewed. Current Outpatient Medications  Medication Sig Dispense Refill  . acetaminophen (TYLENOL) 325 MG tablet Take 2 tablets (650 mg total) by mouth every 6 (six) hours as needed. (Patient not taking: Reported on 08/05/2018) 30 tablet 0  . diclofenac (VOLTAREN) 75 MG EC tablet Take 1 tablet (75 mg total) by mouth 2 (two) times daily. Take as needed for the pain in your chest wall. (Patient not taking: Reported on 08/05/2018) 20 tablet 0  . famotidine (PEPCID) 20 MG tablet Take 1 tablet (20 mg total) by mouth daily. (Patient not taking: Reported on 08/05/2018) 14 tablet 0  . ibuprofen (ADVIL) 200 MG tablet Take 1 tablet (200 mg total) by mouth every 6 (six) hours as needed. (Patient not taking: Reported on 08/05/2018) 30 tablet 0  . Melatonin 5 MG TABS Take 1 tablet (5 mg total) by mouth at bedtime as needed (sleep). 30 tablet 0  . ranitidine (ZANTAC) 150 MG capsule Take 1 capsule (150 mg total) by mouth every evening. (Patient not taking:  Reported on 08/05/2018) 30 capsule 1   No current facility-administered medications for this visit.      Objective:   Physical Exam There were no vitals taken for this visit. Gen:  Alert, cooperative patient who appears stated age in no acute distress.  Vital signs reviewed. HEENT: EOMI,  MMM Cardiac:  Regular rate and rhythm without murmur auscultated.  Good S1/S2. Pulm:  Clear to auscultation bilaterally with good air movement.  No wheezes or rales noted.   Abd:  Soft/nondistended/nontender.  Good bowel sounds throughout all four quadrants.  No masses noted.  Exts: Non edematous BL  LE, warm and well perfused.

## 2018-09-21 ENCOUNTER — Ambulatory Visit (HOSPITAL_COMMUNITY)
Admission: RE | Admit: 2018-09-21 | Discharge: 2018-09-21 | Disposition: A | Discharge status: Home / Self Care | Payer: Federal, State, Local not specified - Other | Attending: Psychiatry | Admitting: Psychiatry

## 2018-09-21 DIAGNOSIS — F331 Major depressive disorder, recurrent, moderate: Secondary | ICD-10-CM | POA: Insufficient documentation

## 2018-09-21 DIAGNOSIS — R51 Headache: Secondary | ICD-10-CM | POA: Insufficient documentation

## 2018-09-21 DIAGNOSIS — G4700 Insomnia, unspecified: Secondary | ICD-10-CM | POA: Insufficient documentation

## 2018-09-21 NOTE — H&P (Signed)
Behavioral Health Medical Screening Exam  Bryan Chaney is an 28 y.o. male.  Total Time spent with patient: 30 minutes  Psychiatric Specialty Exam: Physical Exam  Nursing note and vitals reviewed. Constitutional: He is oriented to person, place, and time. He appears well-developed and well-nourished.  Cardiovascular: Normal rate.  Respiratory: Effort normal.  Musculoskeletal: Normal range of motion.  Neurological: He is alert and oriented to person, place, and time.  Skin: Skin is warm.    Review of Systems  Constitutional: Negative.   HENT: Negative.   Eyes: Negative.   Respiratory: Negative.   Cardiovascular: Negative.   Gastrointestinal: Negative.   Genitourinary: Negative.   Musculoskeletal: Negative.   Skin: Negative.   Neurological: Positive for headaches (Reports 10/10 and has been present for last 3 months).  Endo/Heme/Allergies: Negative.   Psychiatric/Behavioral: Positive for depression. Negative for suicidal ideas. The patient has insomnia.     Blood pressure 125/73, pulse 77, temperature 98.4 F (36.9 C), resp. rate 16, SpO2 100 %.There is no height or weight on file to calculate BMI.  General Appearance: Casual  Eye Contact:  Fair  Speech:  Normal Rate Swahili interpreter was used  Volume:  Normal  Mood:  Depressed  Affect:  Flat  Thought Process:  Coherent and Descriptions of Associations: Intact  Orientation:  Full (Time, Place, and Person)  Thought Content:  WDL  Suicidal Thoughts:  No  Homicidal Thoughts:  No  Memory:  Immediate;   Good Recent;   Good Remote;   Good  Judgement:  Fair  Insight:  Fair  Psychomotor Activity:  Normal  Concentration: Concentration: Good and Attention Span: Good  Recall:  Good  Fund of Knowledge:Good  Language: Good  Akathisia:  No  Handed:  Right  AIMS (if indicated):     Assets:  Communication Skills Desire for Improvement Financial Resources/Insurance Housing Physical Health Social Support Transportation   Sleep:       Musculoskeletal: Strength & Muscle Tone: within normal limits Gait & Station: normal Patient leans: N/A  Blood pressure 125/73, pulse 77, temperature 98.4 F (36.9 C), resp. rate 16, SpO2 100 %.  Recommendations:  Based on my evaluation the patient does not appear to have an emergency medical condition.However, I did refer patient to the ED for evaluation of his headache due to reported severity and duration  Maryfrances Bunnell, FNP 09/21/2018, 2:02 PM

## 2018-09-21 NOTE — BH Assessment (Signed)
Assessment Note  Bryan Chaney is an 28 y.o. male [facility] with caseworker voluntarily. Pt complains of not sleeping, headaches, confusion and depression. Pt is a refugee and it trying to get his wife and children to the [place] and needs a marriage license which has been difficult to obtain. Pt reports not sleeping and with drawing and not working due to depression. Pt reports hearing and seeing things at night.  Pt reports he is only sleeping "a couple hours a night". Pt denies SI currently or at any time in the past. Pt denies any history of suicide attempts and denies history of self-mutilation. Pt denies homicidal thoughts or physical aggression. Pt denies having access to firearms. Pt denies having any legal problems at this time. Pt denies any current or past substance abuse problems. Pt does not appear to be intoxicated or in withdrawal at this time. Pt has 2 room mates. Pt has no hx of outpatient or inpatient services.   Pt is dressed in street clothes, alert, oriented x4 with soft speech and normal motor behavior. Eye contact is fair and Pt is quiet. Pt's mood is depressed and affect is congruent. Thought process is coherent and relevant. Pt's insight is fair and judgement is fair. There is no indication Pt is currently responding to internal stimuli or experiencing delusional thought content. Pt was cooperative throughout assessment.  Diagnosis:  F33.1 Major depressive disorder, Recurrent episode, Moderate  Past Medical History: No past medical history on file.  No past surgical history on file.  Family History: No family history on file.  Social History:  reports that he has never smoked. He has never used smokeless tobacco. He reports current alcohol use. He reports that he does not use drugs.  Additional Social History:  Alcohol / Drug Use Pain Medications: See MAR Prescriptions: See MAR Over the Counter: See MAR History of alcohol / drug use?: Yes  CIWA: CIWA-Ar BP:  125/73 Pulse Rate: 77 COWS:    Allergies: No Known Allergies  Home Medications: (Not in a hospital admission)   OB/GYN Status:  No LMP for male patient.  General Assessment Data [facility] TTS Assessment: In system Is this a Tele or Face-to-Face Assessment?: Face-to-Face Is this an Initial Assessment or a Re-assessment for this encounter?: Initial Assessment Patient Accompanied by:: Other(Caseworker) Language Other than English: Yes What is your preferred language: Other (Comment: Enter the language)(Swahili) What gender do you identify as?: Male Marital status: Married Living Arrangements: Other (Comment)(Roomates) Can pt return to current living arrangement?: Yes Admission Status: Voluntary Is patient capable of signing voluntary admission?: Yes Referral Source: Other(Caseworker) Insurance type: [employer]il scientistelfpay  Medical Screening Exam [facility] Walk[facility] ONLY) Medical Exam completed: Yes  Crisis Care Plan Living Arrangements: Other (Comment)(Roomates) Name of Psychiatrist: None Name of Therapist: None  Education Status Is patient currently in school?: No Is the patient employed, unemployed or receiving disability?: Unemployed  Risk to self with the past 6 months Suicidal Ideation: No Has patient been a risk to self within the past 6 months prior to admission? : No Suicidal Intent: No Has patient had any suicidal intent within the past 6 months prior to admission? : No Is patient at risk for suicide?: No Suicidal Plan?: No Has patient had any suicidal plan within the past 6 months prior to admission? : No Access to Means: No What has been your use of drugs/alcohol within the last 12 months?: None Previous Attempts/Gestures: No Intentional Self Injurious Behavior: None Family  Suicide History: Unknown Recent stressful life event(s): Conflict (Comment), Loss (Comment), Turmoil (Comment)(Trying to get Wife and children to the [place] for 4 months.  ) Persecutory voices/beliefs?: No Depression: Yes Depression Symptoms: Insomnia, Isolating, Fatigue, Loss of interest in usual pleasures Substance abuse history and/or treatment for substance abuse?: No Suicide prevention information given to non-admitted patients: Not applicable  Risk to Others within the past 6 months Homicidal Ideation: No Does patient have any lifetime risk of violence toward others beyond the six months prior to admission? : Unknown Thoughts of Harm to Others: No Current Homicidal Intent: No Current Homicidal Plan: No Access to Homicidal Means: No History of harm to others?: No Assessment of Violence: None Noted Violent Behavior Description: None Does patient have access to weapons?: No Criminal Charges Pending?: Yes Does patient have a court date: No Is patient on probation?: No  Psychosis Hallucinations: Auditory, Visual(Only at night not sleeping) Delusions: None noted  Mental Status Report Appearance/Hygiene: Unremarkable Eye Contact: Fair Motor Activity: Freedom of movement Speech: Soft Level of Consciousness: Quiet/awake Mood: Depressed Affect: Appropriate to circumstance Anxiety Level: None Thought Processes: Thought Blocking Judgement: Unimpaired Orientation: Person, Place, Time, Situation, Appropriate for developmental age Obsessive Compulsive Thoughts/Behaviors: None  Cognitive Functioning Concentration: Normal Memory: Recent Intact Is patient IDD: No Insight: Fair Impulse Control: Fair Appetite: Good Have you had any weight changes? : No Change Sleep: Decreased Total Hours of Sleep: 2 Vegetative Symptoms: None  ADLScreening [facility] Assessment Services) Patient's cognitive ability adequate to safely complete daily activities?: Yes Patient able to express need for assistance with ADLs?: Yes Independently performs ADLs?: Yes (appropriate for developmental age)  Prior Inpatient Therapy Prior Inpatient Therapy: No  Prior Outpatient  Therapy Prior Outpatient Therapy: No Does patient have an ACCT team?: No Does patient have Intensive In-House Services?  : No Does patient have [facility] services? : No Does patient have P4CC services?: No  ADL Screening (condition at time of admission) Patient's cognitive ability adequate to safely complete daily activities?: Yes Is the patient deaf or have difficulty hearing?: No Does the patient have difficulty seeing, even when wearing glasses/contacts?: No Does the patient have difficulty concentrating, remembering, or making decisions?: No Patient able to express need for assistance with ADLs?: Yes Does the patient have difficulty dressing or bathing?: No Independently performs ADLs?: Yes (appropriate for developmental age) Does the patient have difficulty walking or climbing stairs?: No Weakness of Legs: None Weakness of Arms/Hands: None  Home Assistive Devices/Equipment Home Assistive Devices/Equipment: None  Therapy Consults (therapy consults require a physician order) PT Evaluation Needed: No OT Evalulation Needed: No SLP Evaluation Needed: No Abuse/Neglect Assessment (Assessment to be complete while patient is alone) Abuse/Neglect Assessment Can Be Completed: Yes Physical Abuse: Denies Verbal Abuse: Denies Sexual Abuse: Denies Exploitation of patient/patient's resources: Denies Self-Neglect: Denies Values / Beliefs Cultural Requests During Hospitalization: None Spiritual Requests During Hospitalization: None Consults Spiritual Care Consult Needed: No Social Work Consult Needed: No Merchant navy officerAdvance Directives (For Healthcare) Does Patient Have a Medical Advance Directive?: Yes, No Does patient want to make changes to medical advance directive?: No - Patient declined Would patient like information on creating a medical advance directive?: No - Patient declined          Disposition:  Disposition Initial Assessment Completed for this Encounter: Yes Disposition of  Patient: Discharge   Per Reola Calkinsravis Money, NP pt does not meet inpatient criteria.   On Site Evaluation by:   Reviewed with Physician:    Danae OrleansVanessa  Jancie Kercher, MA, [facility]  09/21/2018 2:05 PM

## 2019-04-03 IMAGING — CR DG CHEST 1V
1 series · 1 of 1 positions shown · non-contrast
Comparison: None.

CLINICAL DATA: 26-year-old male with a history of positive PPD
exam. No current chest complaints.

EXAM:
CHEST 1 VIEW

[w chest pa]
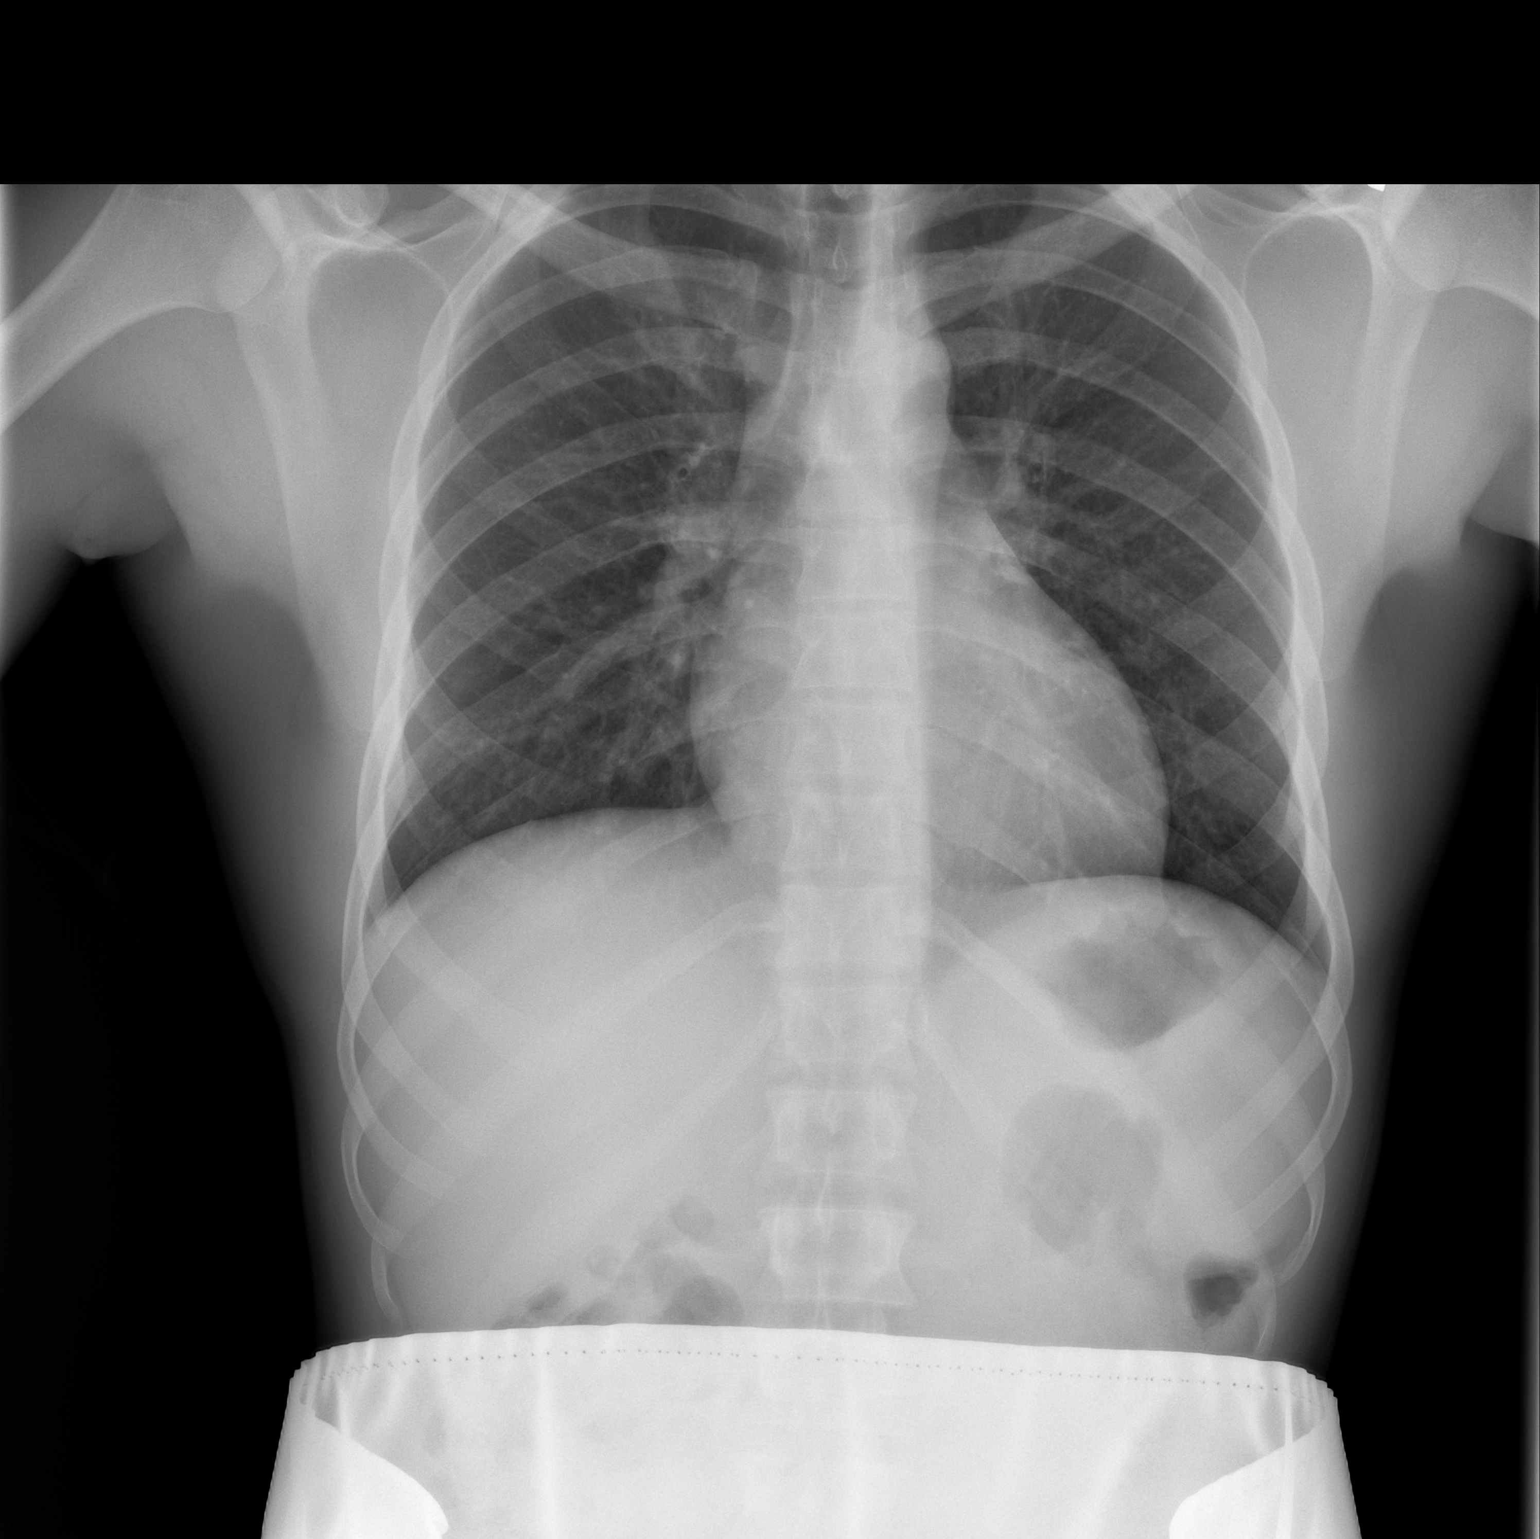

[1 of 1 positions shown; findings below may reference images not displayed]

FINDINGS: The heart size and mediastinal contours are within normal limits.
Both lungs are clear. The visualized skeletal structures are
unremarkable.
IMPRESSION: Negative for acute cardiopulmonary disease

## 2019-08-01 IMAGING — US US ABDOMEN LIMITED
1 series · 1 of 1 positions shown · non-contrast
Comparison: Chest radiograph 08/09/2017.

CLINICAL DATA: 26-year-old male refugee from [place]. Splenomegaly
on exam.

EXAM:
ULTRASOUND ABDOMEN LIMITED

[Series 1: us abdomen limited · 0.25mm/px · 1 of 1 slices shown]
[im 1/1]
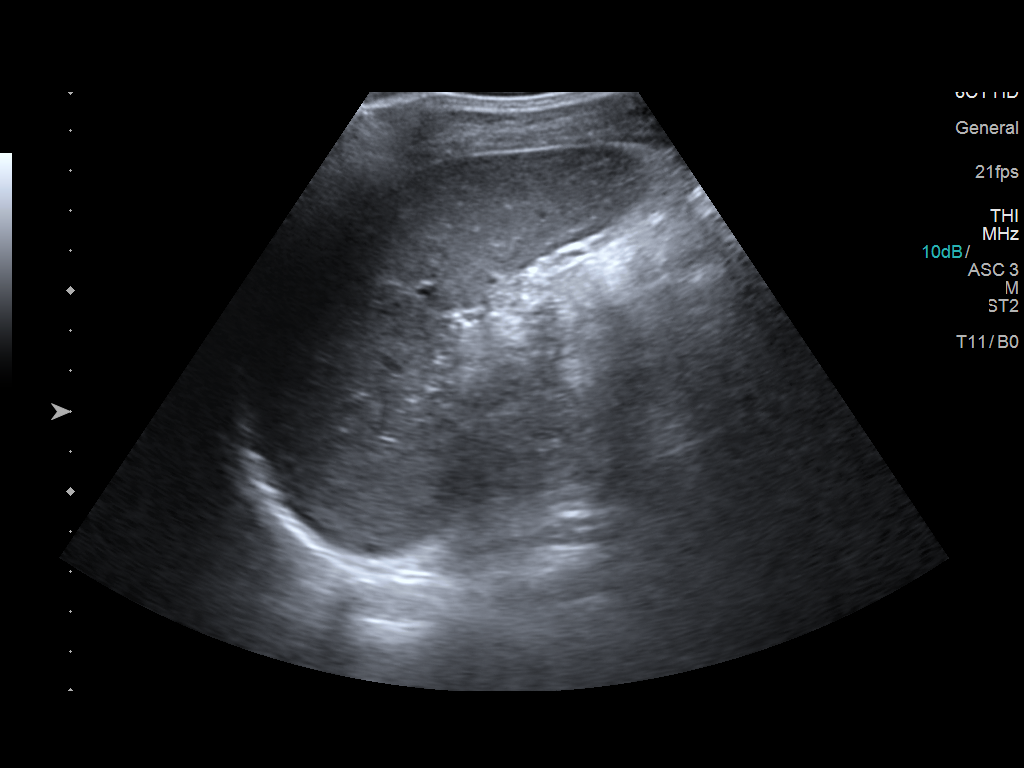

[1 of 1 positions shown; findings below may reference images not displayed]

FINDINGS: Gray scale ultrasound evaluation of the spleen in the left upper
quadrant. Splenic length is 12 cm, within normal limits. Calculated
splenic volume is 271 mL (normal splenic volume range 83 - 412 mL).
Splenic echogenicity is within normal limits. No perisplenic fluid.
IMPRESSION: Normal size and ultrasound appearance of the spleen.

## 2022-08-05 ENCOUNTER — Other Ambulatory Visit: Payer: Self-pay

## 2022-08-05 ENCOUNTER — Emergency Department (HOSPITAL_COMMUNITY): Payer: 59

## 2022-08-05 ENCOUNTER — Emergency Department (HOSPITAL_COMMUNITY)
Admission: EM | Admit: 2022-08-05 | Discharge: 2022-08-05 | Disposition: A | Discharge status: Home / Self Care | Payer: 59 | Attending: Emergency Medicine | Admitting: Emergency Medicine

## 2022-08-05 ENCOUNTER — Encounter (HOSPITAL_COMMUNITY): Payer: Self-pay

## 2022-08-05 DIAGNOSIS — R112 Nausea with vomiting, unspecified: Secondary | ICD-10-CM | POA: Insufficient documentation

## 2022-08-05 DIAGNOSIS — R1084 Generalized abdominal pain: Secondary | ICD-10-CM | POA: Insufficient documentation

## 2022-08-05 LAB — COMPREHENSIVE METABOLIC PANEL
ALT: 54 U/L — ABNORMAL HIGH (ref 0–44)
AST: 33 U/L (ref 15–41)
Albumin: 4.1 g/dL (ref 3.5–5.0)
Alkaline Phosphatase: 55 U/L (ref 38–126)
Anion gap: 9 (ref 5–15)
BUN: 12 mg/dL (ref 6–20)
CO2: 27 mmol/L (ref 22–32)
Calcium: 9.3 mg/dL (ref 8.9–10.3)
Chloride: 103 mmol/L (ref 98–111)
Creatinine, Ser: 0.83 mg/dL (ref 0.61–1.24)
GFR, Estimated: >60 mL/min (ref >60)
Glucose, Bld: 86 mg/dL (ref 70–99)
Potassium: 4.1 mmol/L (ref 3.5–5.1)
Sodium: 139 mmol/L (ref 135–145)
Total Bilirubin: 0.9 mg/dL (ref 0.3–1.2)
Total Protein: 7.1 g/dL (ref 6.5–8.1)

## 2022-08-05 LAB — URINALYSIS, ROUTINE W REFLEX MICROSCOPIC
Bilirubin Urine: NEGATIVE
Glucose, UA: NEGATIVE mg/dL
Hgb urine dipstick: NEGATIVE
Ketones, ur: NEGATIVE mg/dL
Leukocytes,Ua: NEGATIVE
Nitrite: NEGATIVE
Protein, ur: NEGATIVE mg/dL
Specific Gravity, Urine: 1.003 — ABNORMAL LOW (ref 1.005–1.030)
pH: 7 (ref 5.0–8.0)

## 2022-08-05 LAB — CBC
HCT: 48 % (ref 39.0–52.0)
Hemoglobin: 15.9 g/dL (ref 13.0–17.0)
MCH: 26.5 pg (ref 26.0–34.0)
MCHC: 33.1 g/dL (ref 30.0–36.0)
MCV: 80 fL (ref 80.0–100.0)
Platelets: 177 10*3/uL (ref 150–400)
RBC: 6 MIL/uL — ABNORMAL HIGH (ref 4.22–5.81)
RDW: 14.4 % (ref 11.5–15.5)
WBC: 4.5 10*3/uL (ref 4.0–10.5)
nRBC: 0 % (ref 0.0–0.2)

## 2022-08-05 LAB — TROPONIN I (HIGH SENSITIVITY)
Troponin I (High Sensitivity): 3 ng/L (ref <18)
Troponin I (High Sensitivity): <2 ng/L (ref <18)

## 2022-08-05 LAB — LIPASE, BLOOD: Lipase: 32 U/L (ref 11–51)

## 2022-08-05 MED ORDER — ONDANSETRON 4 MG PO TBDP: 4.0000 mg | ORAL_TABLET | Freq: Once | ORAL | Status: DC | PRN

## 2022-08-05 NOTE — ED Provider Notes (Signed)
[facility] EMERGENCY DEPARTMENT Provider Note   CSN: [id number] Arrival date & time: 08/05/22  1200     History  Chief Complaint  Patient presents with   Abdominal Pain    Bryan Chaney is a 31 y.o. male.  Pt is a 31 yo male with no significant pmhx.  He presented to triage with complaints of abd pain and n/v.  He denies f/c.  However, by the time he got a bed, he said he wanted to go and did not want to tell me anything else and wanted a note for work.  He was offered an interpreter, but said he did not need one.       Home Medications Prior to Admission medications   Medication Sig Start Date End Date Taking? Authorizing Provider  acetaminophen (TYLENOL) 325 MG tablet Take 2 tablets (650 mg total) by mouth every 6 (six) hours as needed. Patient not taking: Reported on 08/05/2018 08/29/17   Leland Her, DO  diclofenac (VOLTAREN) 75 MG EC tablet Take 1 tablet (75 mg total) by mouth 2 (two) times daily. Take as needed for the pain in your chest wall. Patient not taking: Reported on 08/05/2018 10/16/17   Mardella Layman, MD  famotidine (PEPCID) 20 MG tablet Take 1 tablet (20 mg total) by mouth daily. Patient not taking: Reported on 08/05/2018 07/26/18   Caccavale, Sophia, PA-C  ibuprofen (ADVIL) 200 MG tablet Take 1 tablet (200 mg total) by mouth every 6 (six) hours as needed. Patient not taking: Reported on 08/05/2018 08/29/17   Leland Her, DO  Melatonin 5 MG TABS Take 1 tablet (5 mg total) by mouth at bedtime as needed (sleep). 08/05/18   Long, Arlyss Repress, MD  ranitidine (ZANTAC) 150 MG capsule Take 1 capsule (150 mg total) by mouth every evening. Patient not taking: Reported on 08/05/2018 10/16/17   Mardella Layman, MD      Allergies    Patient has no known allergies.    Review of Systems   Review of Systems  Gastrointestinal:  Positive for abdominal pain.  All other systems reviewed and are negative.   Physical Exam Updated Vital Signs BP 129/75   Pulse 75    Temp 98 F (36.7 C) (Oral)   Resp 18   SpO2 100%  Physical Exam Vitals and nursing note reviewed.  Constitutional:      Appearance: He is well-developed.  HENT:     Head: Normocephalic and atraumatic.  Eyes:     Extraocular Movements: Extraocular movements intact.     Pupils: Pupils are equal, round, and reactive to light.  Cardiovascular:     Rate and Rhythm: Normal rate and regular rhythm.     Heart sounds: Normal heart sounds.  Pulmonary:     Effort: Pulmonary effort is normal.     Breath sounds: Normal breath sounds.  Abdominal:     General: Abdomen is flat. Bowel sounds are normal.     Palpations: Abdomen is soft.     Tenderness: There is no abdominal tenderness.  Skin:    General: Skin is warm.     Capillary Refill: Capillary refill takes less than 2 seconds.  Neurological:     General: No focal deficit present.     Mental Status: He is alert and oriented to person, place, and time.  Psychiatric:        Mood and Affect: Mood normal.        Behavior: Behavior normal.  ED Results / Procedures / Treatments   Labs (all labs ordered are listed, but only abnormal results are displayed) Labs Reviewed  COMPREHENSIVE METABOLIC PANEL - Abnormal; Notable for the following components:      Result Value   ALT 54 (*)    All other components within normal limits  CBC - Abnormal; Notable for the following components:   RBC 6.00 (*)    All other components within normal limits  URINALYSIS, ROUTINE W REFLEX MICROSCOPIC - Abnormal; Notable for the following components:   Color, Urine STRAW (*)    Specific Gravity, Urine 1.003 (*)    All other components within normal limits  LIPASE, BLOOD  TROPONIN I (HIGH SENSITIVITY)  TROPONIN I (HIGH SENSITIVITY)    EKG EKG Interpretation  Date/Time:  Friday August 05 2022 13:17:10 EST Ventricular Rate:  70 PR Interval:  164 QRS Duration: 82 QT Interval:  358 QTC Calculation: 386 R Axis:   38 Text Interpretation: Normal  sinus rhythm Normal ECG BER similar to prior When compared with ECG of 26-Jul-2018 18:10, PREVIOUS ECG IS PRESENT Confirmed by Vivi Barrack [phone]) on 08/05/2022 1:30:19 PM  Radiology DG Chest 2 View  Result Date: 08/05/2022 CLINICAL DATA:  Shortness of breath since yesterday. Intermittent abdominal pain with nausea and vomiting for the past month. EXAM: CHEST - 2 VIEW COMPARISON:  Chest x-ray dated August 09, 2017. FINDINGS: The heart size and mediastinal contours are within normal limits. Mildly low lung volumes. No focal consolidation, pleural effusion, or pneumothorax. The visualized skeletal structures are unremarkable. IMPRESSION: No active cardiopulmonary disease. Electronically Signed   By: Obie Dredge M.D.   On: 08/05/2022 13:21    Procedures Procedures    Medications Ordered in ED Medications  ondansetron (ZOFRAN-ODT) disintegrating tablet 4 mg (has no administration in time range)    ED Course/ Medical Decision Making/ A&P                           Medical Decision Making  This patient presents to the ED for concern of abd pain, this involves an extensive number of treatment options, and is a complaint that carries with it a high risk of complications and morbidity.  The differential diagnosis includes appendicitis, cholecystitis, uti, other infection   Co morbidities that complicate the patient evaluation  none   Additional history obtained:  Additional history obtained from epic chart review   Lab Tests:  I Ordered, and personally interpreted labs.  The pertinent results include:  cbc nl, cmp nl, lip nl, trop nl, ua nl   Imaging Studies ordered:  I ordered imaging studies including cxr  I independently visualized and interpreted imaging which showed No active cardiopulmonary disease.  I agree with the radiologist interpretation   Medicines ordered and prescription drug management:  I have reviewed the patients home medicines and have made  adjustments as needed   Test Considered:  Ct, but no pain now and pt wants to leave    Problem List / ED Course:  Abd pain:  labs nl.  Vitals nl.  Pain is gone now.  Pt wants to leave.  He is stable for d/c.  He is to return if worse.    Reevaluation:  After the interventions noted above, I reevaluated the patient and found that they have :improved   Social Determinants of Health:  Lives at home   Dispostion:  After consideration of the diagnostic results and the patients response to  treatment, I feel that the patent would benefit from discharge with outpatient f/u.          Final Clinical Impression(s) / ED Diagnoses Final diagnoses:  Generalized abdominal pain    Rx / DC Orders ED Discharge Orders     None         Jacalyn Lefevre, MD 08/05/22 1821

## 2022-08-05 NOTE — ED Triage Notes (Signed)
PT arrived POV from home c/o generalized abdominal pain and intermittent N/V x1 month.

## 2022-08-05 NOTE — ED Provider Triage Note (Signed)
Emergency Medicine Provider Triage Evaluation Note  Bryan Chaney , a 31 y.o. male  was evaluated in triage.  Pt complains of nausea, vomiting, abdominal pain, as well as some ongoing chest pain, shortness of breath.  Patient reports that he has been struggling with abdominal pain, chest pain, shortness of breath for greater than a month, however began having worse nausea, vomiting today.  He denies fever, chills.  Review of Systems  Positive: Chest pain, nausea, vomiting, abd pain Negative: Fever, chills  Physical Exam  BP 126/85 (BP Location: Right Arm)   Pulse 72   Temp 98.2 F (36.8 C)   Resp 16   SpO2 99%  Gen:   Awake, no distress   Resp:  Normal effort  MSK:   Moves extremities without difficulty  Other:  No significant TTP of chest wall, no accessory breath sounds on ausc, normal heart rate and rhythm  Medical Decision Making  Medically screening exam initiated at 12:50 PM.  Appropriate orders placed.  Crit Godshall was informed that the remainder of the evaluation will be completed by another provider, this initial triage assessment does not replace that evaluation, and the importance of remaining in the ED until their evaluation is complete.  Workup initiated   Olene Floss, [place] 08/05/22 1252

## 2023-04-23 ENCOUNTER — Emergency Department (HOSPITAL_COMMUNITY): Payer: Self-pay

## 2023-04-23 ENCOUNTER — Encounter (HOSPITAL_COMMUNITY): Payer: Self-pay | Admitting: *Deleted

## 2023-04-23 ENCOUNTER — Other Ambulatory Visit: Payer: Self-pay

## 2023-04-23 ENCOUNTER — Emergency Department (HOSPITAL_COMMUNITY)
Admission: EM | Admit: 2023-04-23 | Discharge: 2023-04-23 | Disposition: A | Discharge status: Home / Self Care | Payer: Self-pay | Attending: Emergency Medicine | Admitting: Emergency Medicine

## 2023-04-23 DIAGNOSIS — N179 Acute kidney failure, unspecified: Secondary | ICD-10-CM | POA: Insufficient documentation

## 2023-04-23 DIAGNOSIS — S61511A Laceration without foreign body of right wrist, initial encounter: Secondary | ICD-10-CM | POA: Insufficient documentation

## 2023-04-23 DIAGNOSIS — W25XXXA Contact with sharp glass, initial encounter: Secondary | ICD-10-CM | POA: Insufficient documentation

## 2023-04-23 DIAGNOSIS — S00511A Abrasion of lip, initial encounter: Secondary | ICD-10-CM | POA: Insufficient documentation

## 2023-04-23 DIAGNOSIS — F10920 Alcohol use, unspecified with intoxication, uncomplicated: Secondary | ICD-10-CM

## 2023-04-23 DIAGNOSIS — S0081XA Abrasion of other part of head, initial encounter: Secondary | ICD-10-CM | POA: Insufficient documentation

## 2023-04-23 DIAGNOSIS — R4182 Altered mental status, unspecified: Secondary | ICD-10-CM | POA: Insufficient documentation

## 2023-04-23 DIAGNOSIS — Z23 Encounter for immunization: Secondary | ICD-10-CM | POA: Insufficient documentation

## 2023-04-23 DIAGNOSIS — F10220 Alcohol dependence with intoxication, uncomplicated: Secondary | ICD-10-CM | POA: Insufficient documentation

## 2023-04-23 LAB — CBC WITH DIFFERENTIAL/PLATELET
Abs Immature Granulocytes: 0.02 10*3/uL (ref 0.00–0.07)
Basophils Absolute: 0 10*3/uL (ref 0.0–0.1)
Basophils Relative: 0 %
Eosinophils Absolute: 0.4 10*3/uL (ref 0.0–0.5)
Eosinophils Relative: 8 %
HCT: 50.2 % (ref 39.0–52.0)
Hemoglobin: 16.3 g/dL (ref 13.0–17.0)
Immature Granulocytes: 0 %
Lymphocytes Relative: 36 %
Lymphs Abs: 1.7 10*3/uL (ref 0.7–4.0)
MCH: 25.3 pg — ABNORMAL LOW (ref 26.0–34.0)
MCHC: 32.5 g/dL (ref 30.0–36.0)
MCV: 77.8 fL — ABNORMAL LOW (ref 80.0–100.0)
Monocytes Absolute: 0.3 10*3/uL (ref 0.1–1.0)
Monocytes Relative: 6 %
Neutro Abs: 2.4 10*3/uL (ref 1.7–7.7)
Neutrophils Relative %: 50 %
Platelets: 171 10*3/uL (ref 150–400)
RBC: 6.45 MIL/uL — ABNORMAL HIGH (ref 4.22–5.81)
RDW: 15.4 % (ref 11.5–15.5)
WBC: 4.8 10*3/uL (ref 4.0–10.5)
nRBC: 0 % (ref 0.0–0.2)

## 2023-04-23 LAB — BASIC METABOLIC PANEL
Anion gap: 12 (ref 5–15)
BUN: 11 mg/dL (ref 6–20)
CO2: 22 mmol/L (ref 22–32)
Calcium: 9.2 mg/dL (ref 8.9–10.3)
Chloride: 107 mmol/L (ref 98–111)
Creatinine, Ser: 1.39 mg/dL — ABNORMAL HIGH (ref 0.61–1.24)
GFR, Estimated: >60 mL/min (ref >60)
Glucose, Bld: 101 mg/dL — ABNORMAL HIGH (ref 70–99)
Potassium: 4.1 mmol/L (ref 3.5–5.1)
Sodium: 141 mmol/L (ref 135–145)

## 2023-04-23 LAB — RAPID URINE DRUG SCREEN, HOSP PERFORMED
Amphetamines: NOT DETECTED
Barbiturates: NOT DETECTED
Benzodiazepines: NOT DETECTED
Cocaine: NOT DETECTED
Opiates: NOT DETECTED
Tetrahydrocannabinol: NOT DETECTED

## 2023-04-23 LAB — HEPATIC FUNCTION PANEL
ALT: 29 U/L (ref 0–44)
AST: 28 U/L (ref 15–41)
Albumin: 4 g/dL (ref 3.5–5.0)
Alkaline Phosphatase: 69 U/L (ref 38–126)
Bilirubin, Direct: 0.1 mg/dL (ref 0.0–0.2)
Indirect Bilirubin: 0.5 mg/dL (ref 0.3–0.9)
Total Bilirubin: 0.6 mg/dL (ref 0.3–1.2)
Total Protein: 7.5 g/dL (ref 6.5–8.1)

## 2023-04-23 LAB — URINALYSIS, ROUTINE W REFLEX MICROSCOPIC
Bilirubin Urine: NEGATIVE
Glucose, UA: NEGATIVE mg/dL
Hgb urine dipstick: NEGATIVE
Ketones, ur: NEGATIVE mg/dL
Leukocytes,Ua: NEGATIVE
Nitrite: NEGATIVE
Protein, ur: NEGATIVE mg/dL
Specific Gravity, Urine: 1.012 (ref 1.005–1.030)
pH: 5 (ref 5.0–8.0)

## 2023-04-23 LAB — LIPASE, BLOOD: Lipase: 30 U/L (ref 11–51)

## 2023-04-23 LAB — ETHANOL: Alcohol, Ethyl (B): 241 mg/dL — ABNORMAL HIGH (ref <10)

## 2023-04-23 MED ORDER — LACTATED RINGERS IV BOLUS
1000.0000 mL | Freq: Once | INTRAVENOUS | Status: AC
  Administered 2023-04-23: 1000 mL via INTRAVENOUS

## 2023-04-23 MED ORDER — TETANUS-DIPHTH-ACELL PERTUSSIS 5-2.5-18.5 LF-MCG/0.5 IM SUSY
0.5000 mL | PREFILLED_SYRINGE | Freq: Once | INTRAMUSCULAR | Status: AC
  Administered 2023-04-23: 0.5 mL via INTRAMUSCULAR
  Filled 2023-04-23: qty 0.5

## 2023-04-23 NOTE — ED Notes (Signed)
Patient is resting comfortably. 

## 2023-04-23 NOTE — Progress Notes (Signed)
CSW received call from RN regarding patient's need for transportation. CSW informed RN to provide patient with a bus pass.  Edwin Dada, MSW, LCSW Transitions of Care  Clinical Social Worker II [phone]

## 2023-04-23 NOTE — Discharge Instructions (Addendum)
Thank you for letting us take care of you today.   Your CT scans of your head and neck were normal. Your liver and pancreatic functions were normal. Your kidney function was high. You need to have your kidney function rechecked by your PCP.  It was likely elevated due to dehydration from your alcohol intoxication.  Keep the cut to your wrist clean, dry, and bandaged.  If you develop redness, swelling, increased pain, or puslike drainage around it you should be reevaluated.  I provided 2 primary care providers that you may follow-up with or you may go to a PCP of your own choosing.  For new or worsening symptoms, return to the nearest ED for reevaluation.

## 2023-04-23 NOTE — ED Triage Notes (Signed)
Patient presents to ed via [facility] states he was singing with a glass in  his hand and fell the glass breaking and laceration to right wrist bleeding controled abrasion under right nare. C/o bilateral hand pain and head. Denies ETOH eyes are "blood shot"patient does speak [place].

## 2023-04-23 NOTE — ED Provider Notes (Addendum)
[place] EMERGENCY DEPARTMENT AT [facility] Provider Note   CSN: [id number] Arrival date & time: 04/23/23  0535     History  Chief Complaint  Patient presents with   Fall    Bryan Chaney is a 32 y.o. male who presents to the ED via EMS for evaluation.  Unfortunately, at time of my initial evaluation [place] interpreter is unavailable and patient has no family or friends at bedside.  All history obtained from nursing at this time.  It is reported that patient had a glass in his hand and accidentally fell breaking the glass and sustained a small laceration to his right dorsal wrist.  He initially denied alcohol use to nursing staff but does smell of alcohol on exam.  Also has a small abrasion above his right upper lip.  Remainder of history unknown at this time.  Will continue to attempt to get Swahili interpreter and obtain further history from pt as able.        Home Medications Prior to Admission medications   Medication Sig Start Date End Date Taking? Authorizing Provider  acetaminophen (TYLENOL) 325 MG tablet Take 2 tablets (650 mg total) by mouth every 6 (six) hours as needed. Patient not taking: Reported on 08/05/2018 08/29/17   Leland Her, DO  diclofenac (VOLTAREN) 75 MG EC tablet Take 1 tablet (75 mg total) by mouth 2 (two) times daily. Take as needed for the pain in your chest wall. Patient not taking: Reported on 08/05/2018 10/16/17   Mardella Layman, MD  famotidine (PEPCID) 20 MG tablet Take 1 tablet (20 mg total) by mouth daily. Patient not taking: Reported on 08/05/2018 07/26/18   Caccavale, Sophia, PA-C  ibuprofen (ADVIL) 200 MG tablet Take 1 tablet (200 mg total) by mouth every 6 (six) hours as needed. Patient not taking: Reported on 08/05/2018 08/29/17   Leland Her, DO  Melatonin 5 MG TABS Take 1 tablet (5 mg total) by mouth at bedtime as needed (sleep). 08/05/18   Long, Arlyss Repress, MD  ranitidine (ZANTAC) 150 MG capsule Take 1 capsule (150 mg total) by mouth  every evening. Patient not taking: Reported on 08/05/2018 10/16/17   Mardella Layman, MD      Allergies    Patient has no known allergies.    Review of Systems   Review of Systems  Unable to perform ROS: Mental status change    Physical Exam Updated Vital Signs BP 110/66   Pulse 78   Temp 98.7 F (37.1 C) (Oral)   Resp 18   Ht 5\' 2"  (1.575 m)   Wt 59.8 kg   SpO2 96%   BMI 24.11 kg/m  Physical Exam Vitals and nursing note reviewed.  Constitutional:      General: He is not in acute distress.    Appearance: He is not toxic-appearing.     Comments: Sleeping comfortably, no acute distress, will arouse to verbal stimuli but immediately closes eyes and goes back to sleep  HENT:     Head: Normocephalic.     Comments: Superficial abrasion above right upper lip    Right Ear: External ear normal.     Left Ear: External ear normal.     Nose: Nose normal.     Mouth/Throat:     Mouth: Mucous membranes are moist.  Eyes:     General: No scleral icterus.    Pupils: Pupils are equal, round, and reactive to light.  Cardiovascular:     Rate and Rhythm: Normal  rate and regular rhythm.  Pulmonary:     Effort: Pulmonary effort is normal. No respiratory distress.     Breath sounds: Normal breath sounds.  Abdominal:     General: Abdomen is flat.     Palpations: Abdomen is soft.  Musculoskeletal:        General: No deformity.     Cervical back: Normal range of motion and neck supple. No rigidity.     Right lower leg: No edema.     Left lower leg: No edema.     Comments: Moving extremities spontaneously x 4, 1cm laceration to right dorsal wrist, no active bleeding, no evidence of foreign body, 2+ radial pulse, range of motion grossly intact, linear superficial abrasions extending up forearm without active bleeding  Skin:    General: Skin is warm and dry.     Capillary Refill: Capillary refill takes less than 2 seconds.  Neurological:     Comments: Lethargic but arouses to verbal stimuli,  no focal deficits identified     ED Results / Procedures / Treatments   Labs (all labs ordered are listed, but only abnormal results are displayed) Labs Reviewed  CBC WITH DIFFERENTIAL/PLATELET - Abnormal; Notable for the following components:      Result Value   RBC 6.45 (*)    MCV 77.8 (*)    MCH 25.3 (*)    All other components within normal limits  BASIC METABOLIC PANEL - Abnormal; Notable for the following components:   Glucose, Bld 101 (*)    Creatinine, Ser 1.39 (*)    All other components within normal limits  URINALYSIS, ROUTINE W REFLEX MICROSCOPIC - Abnormal; Notable for the following components:   Color, Urine STRAW (*)    All other components within normal limits  ETHANOL - Abnormal; Notable for the following components:   Alcohol, Ethyl (B) 241 (*)    All other components within normal limits  RAPID URINE DRUG SCREEN, HOSP PERFORMED  HEPATIC FUNCTION PANEL  LIPASE, BLOOD    EKG None  Radiology DG Chest 2 View  Result Date: 04/23/2023 CLINICAL DATA:  ams injury right wrist hand EXAM: CHEST - 2 VIEW COMPARISON:  08/05/2022 FINDINGS: Lungs are clear. Heart size and mediastinal contours are within normal limits. No effusion. Visualized bones unremarkable. IMPRESSION: No acute cardiopulmonary disease. Electronically Signed   By: Corlis Leak M.D.   On: 04/23/2023 08:33   DG Hand 2 View Right  Result Date: 04/23/2023 CLINICAL DATA:  ams injury right wrist hand EXAM: RIGHT HAND - 2 VIEW COMPARISON:  None Available. FINDINGS: There is no evidence of fracture or dislocation. There is no evidence of arthropathy or other focal bone abnormality. Soft tissues are unremarkable. IMPRESSION: Negative. Electronically Signed   By: Corlis Leak M.D.   On: 04/23/2023 08:32   DG Wrist Complete Right  Result Date: 04/23/2023 CLINICAL DATA:  ams injury right wrist hand EXAM: RIGHT WRIST - COMPLETE 3+ VIEW COMPARISON:  None Available. FINDINGS: There is no evidence of fracture or  dislocation. There is no evidence of arthropathy or other focal bone abnormality. Soft tissues are unremarkable. IMPRESSION: Negative. Electronically Signed   By: Corlis Leak M.D.   On: 04/23/2023 08:32   CT Cervical Spine Wo Contrast  Result Date: 04/23/2023 CLINICAL DATA:  32 year old male with altered mental status, intoxicated, trauma. EXAM: CT CERVICAL SPINE WITHOUT CONTRAST TECHNIQUE: Multidetector CT imaging of the cervical spine was performed without intravenous contrast. Multiplanar CT image reconstructions were also generated. RADIATION DOSE  REDUCTION: This exam was performed according to the departmental dose-optimization program which includes automated exposure control, adjustment of the mA and/or kV according to patient size and/or use of iterative reconstruction technique. COMPARISON:  Head CT today reported separately. FINDINGS: Alignment: Relatively normal cervical lordosis. Cervicothoracic junction alignment is within normal limits. Bilateral posterior element alignment is within normal limits. Skull base and vertebrae: Bone mineralization is within normal limits. Visualized skull base is intact. No atlanto-occipital dissociation. C1 and C2 appear intact and aligned. No significant osseous abnormality identified. Soft tissues and spinal canal: No prevertebral fluid or swelling. No visible canal hematoma. Negative visible noncontrast neck soft tissues. Disc levels: Minor disc bulging and degenerative endplate spurring limited to C7-T1. Upper chest: Negative when allowing for mild respiratory motion. IMPRESSION: 1. No acute traumatic injury identified in the cervical spine. 2. Minor disc and endplate degeneration at C7-T1. Electronically Signed   By: Odessa Fleming M.D.   On: 04/23/2023 07:48   CT Head Wo Contrast  Result Date: 04/23/2023 CLINICAL DATA:  32 year old male with altered mental status, intoxicated, trauma. EXAM: CT HEAD WITHOUT CONTRAST TECHNIQUE: Contiguous axial images were obtained from  the base of the skull through the vertex without intravenous contrast. RADIATION DOSE REDUCTION: This exam was performed according to the departmental dose-optimization program which includes automated exposure control, adjustment of the mA and/or kV according to patient size and/or use of iterative reconstruction technique. COMPARISON:  Head CT 08/05/2018. FINDINGS: Brain: Normal cerebral volume. No midline shift, ventriculomegaly, mass effect, evidence of mass lesion, intracranial hemorrhage or evidence of cortically based acute infarction. Gray-white matter differentiation is within normal limits throughout the brain. Vascular: There is chronic increased intracranial vascular density. Superior sagittal sinus is conspicuous but no convincing pathologic hyperdense vasculature when comparing to prior. Skull: Intact, negative. Sinuses/Orbits: Minor paranasal sinus mucosal thickening is stable since 2019 with no sinus fluid levels. Tympanic cavities and mastoids are clear. Other: No definite acute orbit or scalp soft tissue injury. IMPRESSION: 1. Stable, negative noncontrast CT appearance of the brain. 2. No acute traumatic injury identified. Electronically Signed   By: Odessa Fleming M.D.   On: 04/23/2023 07:46    Procedures Procedures    Medications Ordered in ED Medications  lactated ringers bolus 1,000 mL (0 mLs Intravenous Stopped 04/23/23 0937)  Tdap (BOOSTRIX) injection 0.5 mL (0.5 mLs Intramuscular Given 04/23/23 0820)  lactated ringers bolus 1,000 mL (0 mLs Intravenous Stopped 04/23/23 1048)    ED Course/ Medical Decision Making/ A&P Clinical Course as of 04/23/23 1343  Sun Apr 23, 2023  0810 Patient rechecked.  Small laceration to the wrist remains without active bleeding.  He is awake and alert at this time.  Answering questions appropriately.  He states that last night he accidentally fell breaking a glass which cut his wrist and also caused the abrasion to his face.  He denies headache, vision changes,  LOC, nausea, vomiting, chest pain, or shortness of breath.  He denies alcohol use.  His only complaint is diffuse abdominal pain.  No nausea, vomiting, diarrhea, or fever.  No urinary symptoms.  When asked about his abdominal pain he states that he has this "all the time."  Labs have been obtained.  Abdomen soft and nontender.  Will continue to reassess. [MG]  0900 Patient rechecked.  Resting comfortably.  Remains alert and oriented.  Alcohol level is elevated in the 200s.  UDS negative.  Does have an AKI.  Has received 1 L of fluids, will give an additional  liter.  Does not appear volume overloaded.  Otherwise no significant electrolyte disturbance.  Continues to complain of generalized abdominal pain.  Abdomen soft and nontender.  UA negative.  Will add lipase and LFTs.  At this time, plan to metabolize to freedom and continue to reassess. [MG]    Clinical Course User Index [MG] Tonette Lederer, PA-C                                 Medical Decision Making Amount and/or Complexity of Data Reviewed Labs: ordered. Decision-making details documented in ED Course. Radiology: ordered. Decision-making details documented in ED Course. ECG/medicine tests: ordered. Decision-making details documented in ED Course.  Risk Prescription drug management.   Medical Decision Making:   Izic Heyd is a 32 y.o. male who presented to the ED today with fall, AMS detailed above.    Patient's presentation is complicated by their history of questionable alcohol intoxication.  Complete initial physical exam performed, notably the patient was initially lethargic but arousable.  He had a small laceration to his right wrist which was not actively bleeding.  Neurovascularly intact.  Abdomen soft and nontender.    Reviewed and confirmed nursing documentation for past medical history, family history, social history.    Initial Assessment:   With the patient's presentation, the differential diagnosis for AMS is extensive  and includes, but is not limited to: drug overdose - opioids, alcohol, sedatives, antipsychotics, drug withdrawal, others; Metabolic: hypoxia, hypoglycemia, hyperglycemia, hypercalcemia, hypernatremia, hyponatremia, uremia, hepatic encephalopathy, hypothyroidism, hyperthyroidism, vitamin B12 or thiamine deficiency, carbon monoxide poisoning, Wilson's disease, Lactic acidosis, DKA/HHOS; Infectious: meningitis, encephalitis, bacteremia/sepsis, urinary tract infection, pneumonia, neurosyphilis; Structural: Space-occupying lesion, (brain tumor, subdural hematoma, hydrocephalus,); Vascular: stroke, subarachnoid hemorrhage, coronary ischemia, hypertensive encephalopathy, CNS vasculitis, thrombotic thrombocytopenic purpura, disseminated intravascular coagulation, hyperviscosity; Psychiatric: Schizophrenia, depression; Other: Seizure, hypothermia, heat stroke, dementia  This is most consistent with an acute complicated illness  Initial Plan:  Screening labs including CBC and Metabolic panel to evaluate for infectious or metabolic etiology of disease.  Urinalysis with reflex culture ordered to evaluate for UTI or relevant urologic/nephrologic pathology.  CXR to evaluate for structural/infectious intrathoracic pathology. CT brain and CT C-spine to assess for traumatic injuries Right hand and wrist x-ray to assess for trauma Alcohol level, UDS EKG to evaluate for cardiac pathology Wound care Objective evaluation as below reviewed   Initial Study Results:   Laboratory  All laboratory results reviewed without evidence of clinically relevant pathology.   Exceptions include: 1.39 from previous of 0.83, ethanol 241  EKG EKG was reviewed independently. Rate, rhythm, axis, intervals all examined and without medically relevant abnormality. ST segments without concerns for elevations.    Radiology:  All images reviewed independently. Agree with radiology report at this time.   DG Chest 2 View  Result Date:  04/23/2023 CLINICAL DATA:  ams injury right wrist hand EXAM: CHEST - 2 VIEW COMPARISON:  08/05/2022 FINDINGS: Lungs are clear. Heart size and mediastinal contours are within normal limits. No effusion. Visualized bones unremarkable. IMPRESSION: No acute cardiopulmonary disease. Electronically Signed   By: Corlis Leak M.D.   On: 04/23/2023 08:33   DG Hand 2 View Right  Result Date: 04/23/2023 CLINICAL DATA:  ams injury right wrist hand EXAM: RIGHT HAND - 2 VIEW COMPARISON:  None Available. FINDINGS: There is no evidence of fracture or dislocation. There is no evidence of arthropathy or other focal bone abnormality. Soft tissues are  unremarkable. IMPRESSION: Negative. Electronically Signed   By: Corlis Leak M.D.   On: 04/23/2023 08:32   DG Wrist Complete Right  Result Date: 04/23/2023 CLINICAL DATA:  ams injury right wrist hand EXAM: RIGHT WRIST - COMPLETE 3+ VIEW COMPARISON:  None Available. FINDINGS: There is no evidence of fracture or dislocation. There is no evidence of arthropathy or other focal bone abnormality. Soft tissues are unremarkable. IMPRESSION: Negative. Electronically Signed   By: Corlis Leak M.D.   On: 04/23/2023 08:32   CT Cervical Spine Wo Contrast  Result Date: 04/23/2023 CLINICAL DATA:  32 year old male with altered mental status, intoxicated, trauma. EXAM: CT CERVICAL SPINE WITHOUT CONTRAST TECHNIQUE: Multidetector CT imaging of the cervical spine was performed without intravenous contrast. Multiplanar CT image reconstructions were also generated. RADIATION DOSE REDUCTION: This exam was performed according to the departmental dose-optimization program which includes automated exposure control, adjustment of the mA and/or kV according to patient size and/or use of iterative reconstruction technique. COMPARISON:  Head CT today reported separately. FINDINGS: Alignment: Relatively normal cervical lordosis. Cervicothoracic junction alignment is within normal limits. Bilateral posterior element  alignment is within normal limits. Skull base and vertebrae: Bone mineralization is within normal limits. Visualized skull base is intact. No atlanto-occipital dissociation. C1 and C2 appear intact and aligned. No significant osseous abnormality identified. Soft tissues and spinal canal: No prevertebral fluid or swelling. No visible canal hematoma. Negative visible noncontrast neck soft tissues. Disc levels: Minor disc bulging and degenerative endplate spurring limited to C7-T1. Upper chest: Negative when allowing for mild respiratory motion. IMPRESSION: 1. No acute traumatic injury identified in the cervical spine. 2. Minor disc and endplate degeneration at C7-T1. Electronically Signed   By: Odessa Fleming M.D.   On: 04/23/2023 07:48   CT Head Wo Contrast  Result Date: 04/23/2023 CLINICAL DATA:  32 year old male with altered mental status, intoxicated, trauma. EXAM: CT HEAD WITHOUT CONTRAST TECHNIQUE: Contiguous axial images were obtained from the base of the skull through the vertex without intravenous contrast. RADIATION DOSE REDUCTION: This exam was performed according to the departmental dose-optimization program which includes automated exposure control, adjustment of the mA and/or kV according to patient size and/or use of iterative reconstruction technique. COMPARISON:  Head CT 08/05/2018. FINDINGS: Brain: Normal cerebral volume. No midline shift, ventriculomegaly, mass effect, evidence of mass lesion, intracranial hemorrhage or evidence of cortically based acute infarction. Gray-white matter differentiation is within normal limits throughout the brain. Vascular: There is chronic increased intracranial vascular density. Superior sagittal sinus is conspicuous but no convincing pathologic hyperdense vasculature when comparing to prior. Skull: Intact, negative. Sinuses/Orbits: Minor paranasal sinus mucosal thickening is stable since 2019 with no sinus fluid levels. Tympanic cavities and mastoids are clear. Other:  No definite acute orbit or scalp soft tissue injury. IMPRESSION: 1. Stable, negative noncontrast CT appearance of the brain. 2. No acute traumatic injury identified. Electronically Signed   By: Odessa Fleming M.D.   On: 04/23/2023 07:46      Final Assessment and Plan:   32 year old male presents to the ED for evaluation.  Difficult to obtain history initially.  Noted to have fell with a glass in his hand and sustained a laceration to his right wrist.  No acute findings on x-ray of the right wrist and hand.  Wound was extensively irrigated and cleaned.  No active bleeding, Tdap updated, bandaged with padding and bacitracin ointment.  Patient initially denied alcohol consumption but did smell of alcohol.  Difficulty with assessment as patient would not  remain still.  Secondary to this, did not repair laceration as patient would not remain still and it poses a hazard to myself and the patient.  Patient neurovascularly intact.  Eventually able to obtain history on multiple reassessments.  Patient's only complaint is some generalized abdominal pain though he states that he always has this.  Abdomen soft and nontender on serial exams.  Low suspicion for acute abdomen.  Patient afebrile, nontoxic-appearing.  Normal lipase, normal LFTs, no leukocytosis.  No UTI.  Kidney function is elevated from baseline.  Suspect mild AKI secondary to dehydration secondary to acute alcohol intoxication with an alcohol level of 241.  UDS negative.  No acute findings on imaging.  Patient able to metabolize to freedom and without acute complaints on final reassessment.  Ambulatory, tolerating p.o.  Would like to go home.  Given strict ED return precautions, discussed wound care of nonsutured laceration and patient agreeable.  All questions answered and stable for discharge.  Provided with bus pass.  Per charge nurse and social work, patient does not meet criteria for cab voucher and with alcohol level being drawn at 8 AM this morning he is able  to be discharged home with bus pass instead.   Clinical Impression:  1. Alcoholic intoxication without complication (HCC)   2. Acute kidney injury (HCC)   3. Laceration of right wrist, initial encounter   4. Abrasion of face, initial encounter      Discharge           Final Clinical Impression(s) / ED Diagnoses Final diagnoses:  Acute kidney injury (HCC)  Alcoholic intoxication without complication (HCC)  Laceration of right wrist, initial encounter  Abrasion of face, initial encounter    Rx / DC Orders ED Discharge Orders     None         Tonette Lederer, PA-C 04/23/23 1345    Tonette Lederer, PA-C 04/23/23 1353    Tegeler, Canary Brim, MD 04/23/23 1526
